# Patient Record
Sex: Male | Born: 1975 | Race: White | Hispanic: No | Marital: Married | Smoking: Never smoker
Health system: Southern US, Community
[De-identification: ages and names within clinical notes are randomized; demographics above are authoritative.]

## PROBLEM LIST (undated history)

## (undated) DIAGNOSIS — M179 Osteoarthritis of knee, unspecified: Secondary | ICD-10-CM

## (undated) DIAGNOSIS — M1710 Unilateral primary osteoarthritis, unspecified knee: Secondary | ICD-10-CM

## (undated) DIAGNOSIS — T7840XA Allergy, unspecified, initial encounter: Secondary | ICD-10-CM

## (undated) HISTORY — DX: Osteoarthritis of knee, unspecified: M17.9

## (undated) HISTORY — PX: ANTERIOR CRUCIATE LIGAMENT REPAIR: SHX115

## (undated) HISTORY — DX: Unilateral primary osteoarthritis, unspecified knee: M17.10

## (undated) HISTORY — DX: Allergy, unspecified, initial encounter: T78.40XA

---

## 2013-11-10 DIAGNOSIS — F419 Anxiety disorder, unspecified: Secondary | ICD-10-CM | POA: Insufficient documentation

## 2013-11-10 DIAGNOSIS — J309 Allergic rhinitis, unspecified: Secondary | ICD-10-CM | POA: Insufficient documentation

## 2016-05-03 DIAGNOSIS — H52223 Regular astigmatism, bilateral: Secondary | ICD-10-CM | POA: Diagnosis not present

## 2016-05-03 DIAGNOSIS — H5213 Myopia, bilateral: Secondary | ICD-10-CM | POA: Diagnosis not present

## 2016-05-14 DIAGNOSIS — D1801 Hemangioma of skin and subcutaneous tissue: Secondary | ICD-10-CM | POA: Diagnosis not present

## 2016-05-14 DIAGNOSIS — D225 Melanocytic nevi of trunk: Secondary | ICD-10-CM | POA: Diagnosis not present

## 2016-05-14 DIAGNOSIS — D1800 Hemangioma unspecified site: Secondary | ICD-10-CM | POA: Diagnosis not present

## 2016-05-14 DIAGNOSIS — D485 Neoplasm of uncertain behavior of skin: Secondary | ICD-10-CM | POA: Diagnosis not present

## 2016-05-14 DIAGNOSIS — B360 Pityriasis versicolor: Secondary | ICD-10-CM | POA: Diagnosis not present

## 2016-05-14 DIAGNOSIS — L812 Freckles: Secondary | ICD-10-CM | POA: Diagnosis not present

## 2017-04-18 DIAGNOSIS — M25561 Pain in right knee: Secondary | ICD-10-CM | POA: Diagnosis not present

## 2017-04-18 DIAGNOSIS — M238X1 Other internal derangements of right knee: Secondary | ICD-10-CM | POA: Diagnosis not present

## 2017-05-01 DIAGNOSIS — M25561 Pain in right knee: Secondary | ICD-10-CM | POA: Diagnosis not present

## 2017-05-07 DIAGNOSIS — M25561 Pain in right knee: Secondary | ICD-10-CM | POA: Diagnosis not present

## 2017-05-07 DIAGNOSIS — M238X1 Other internal derangements of right knee: Secondary | ICD-10-CM | POA: Diagnosis not present

## 2017-07-07 NOTE — Progress Notes (Signed)
Cristian Odom received his flu shot on 07/04/17 at the [facility] to LT deltoid. Lot # [id number] NDC: [phone] Mfg: [employer] Expires: 02/01/18

## 2017-08-07 ENCOUNTER — Encounter: Payer: Self-pay | Admitting: Family Medicine

## 2017-08-07 ENCOUNTER — Ambulatory Visit (INDEPENDENT_AMBULATORY_CARE_PROVIDER_SITE_OTHER): Payer: No Typology Code available for payment source | Admitting: Family Medicine

## 2017-08-07 VITALS — BP 122/76 | HR 57 | Temp 98.2°F | Ht 77.0 in | Wt 209.0 lb

## 2017-08-07 DIAGNOSIS — F419 Anxiety disorder, unspecified: Secondary | ICD-10-CM

## 2017-08-07 DIAGNOSIS — Z0000 Encounter for general adult medical examination without abnormal findings: Secondary | ICD-10-CM

## 2017-08-07 DIAGNOSIS — J301 Allergic rhinitis due to pollen: Secondary | ICD-10-CM

## 2017-08-07 DIAGNOSIS — Z13220 Encounter for screening for lipoid disorders: Secondary | ICD-10-CM | POA: Diagnosis not present

## 2017-08-07 DIAGNOSIS — M25561 Pain in right knee: Secondary | ICD-10-CM | POA: Diagnosis not present

## 2017-08-07 DIAGNOSIS — Z114 Encounter for screening for human immunodeficiency virus [HIV]: Secondary | ICD-10-CM | POA: Diagnosis not present

## 2017-08-07 DIAGNOSIS — G8929 Other chronic pain: Secondary | ICD-10-CM | POA: Diagnosis not present

## 2017-08-07 LAB — CBC WITH DIFFERENTIAL/PLATELET
Basophils Absolute: 0 10*3/uL (ref 0.0–0.1)
Basophils Relative: 0.5 % (ref 0.0–3.0)
Eosinophils Absolute: 0.3 10*3/uL (ref 0.0–0.7)
Eosinophils Relative: 5.4 % — ABNORMAL HIGH (ref 0.0–5.0)
HCT: 44 % (ref 39.0–52.0)
Hemoglobin: 14.6 g/dL (ref 13.0–17.0)
Lymphocytes Relative: 17.9 % (ref 12.0–46.0)
Lymphs Abs: 0.9 10*3/uL (ref 0.7–4.0)
MCHC: 33.3 g/dL (ref 30.0–36.0)
MCV: 91.1 fl (ref 78.0–100.0)
Monocytes Absolute: 0.5 10*3/uL (ref 0.1–1.0)
Monocytes Relative: 10.3 % (ref 3.0–12.0)
Neutro Abs: 3.4 10*3/uL (ref 1.4–7.7)
Neutrophils Relative %: 65.9 % (ref 43.0–77.0)
Platelets: 152 10*3/uL (ref 150.0–400.0)
RBC: 4.83 Mil/uL (ref 4.22–5.81)
RDW: 13.2 % (ref 11.5–15.5)
WBC: 5.2 10*3/uL (ref 4.0–10.5)

## 2017-08-07 LAB — COMPREHENSIVE METABOLIC PANEL
ALT: 16 U/L (ref 0–53)
AST: 20 U/L (ref 0–37)
Albumin: 4.2 g/dL (ref 3.5–5.2)
Alkaline Phosphatase: 36 U/L — ABNORMAL LOW (ref 39–117)
BUN: 16 mg/dL (ref 6–23)
CO2: 29 mEq/L (ref 19–32)
Calcium: 9.1 mg/dL (ref 8.4–10.5)
Chloride: 105 mEq/L (ref 96–112)
Creatinine, Ser: 1 mg/dL (ref 0.40–1.50)
GFR: 86.37 mL/min (ref >60.00)
Glucose, Bld: 86 mg/dL (ref 70–99)
Potassium: 4 mEq/L (ref 3.5–5.1)
Sodium: 141 mEq/L (ref 135–145)
Total Bilirubin: 0.9 mg/dL (ref 0.2–1.2)
Total Protein: 6.6 g/dL (ref 6.0–8.3)

## 2017-08-07 LAB — LIPID PANEL
Cholesterol: 161 mg/dL (ref 0–200)
HDL: 56.5 mg/dL (ref >39.00)
LDL Cholesterol: 87 mg/dL (ref 0–99)
NonHDL: 104.19
Total CHOL/HDL Ratio: 3
Triglycerides: 86 mg/dL (ref 0.0–149.0)
VLDL: 17.2 mg/dL (ref 0.0–40.0)

## 2017-08-07 LAB — TSH: TSH: 2.15 u[IU]/mL (ref 0.35–4.50)

## 2017-08-07 MED ORDER — FLUTICASONE PROPIONATE 50 MCG/ACT NA SUSP: 2.0000 | Freq: Every day | NASAL | 6 refills | Status: DC

## 2017-08-07 MED FILL — FLUTICASONE PROP 50 MCG SPR: 50 | 30 days supply | Qty: 16 | Fill #0

## 2017-08-07 NOTE — Progress Notes (Signed)
Cristian Odom is a 42 y.o. male is here to [employer].   Patient Care Team: Briscoe Deutscher, DO as PCP - General (Family Medicine)   History of Present Illness:   HPI: See Assessment and Plan section for Problem Based Charting of issues discussed today.  Social History   Social History Narrative   Previously played basketball. Right knee injury that led to arthroscopic clean-out and ACL repair. Bothers him enough to stop him from running. Has Orthopedist at [employer]. Wife is an Therapist, sports. Married in 2017. Combined family with four children, adolescents. Works as Clinical cytogeneticist.    Health Maintenance Due  Topic Date Due  . HIV Screening  11/01/1988   Depression screen PHQ 2/9 08/08/2017  Decreased Interest 0  PHQ - 2 Score 0     PMHx, SurgHx, SocialHx, Medications, and Allergies were reviewed in the Visit Navigator and updated as appropriate.   Past Medical History:  Diagnosis Date  . OA (osteoarthritis) of knee, right     Past Surgical History:  Procedure Laterality Date  . ANTERIOR CRUCIATE LIGAMENT REPAIR Right    History reviewed. No pertinent family history. Social History   Tobacco Use  . Smoking status: Never Smoker  . Smokeless tobacco: Never Used  Substance Use Topics  . Alcohol use: Yes    Alcohol/week: 3.0 oz    Types: 5 Glasses of wine per week  . Drug use: No   Current Medications and Allergies:   Current Outpatient Medications:  .  Cetirizine HCl 10 MG CAPS, Take by mouth., Disp: , Rfl:  .  fluticasone (FLONASE) 50 MCG/ACT nasal spray, Place 2 sprays into both nostrils daily., Disp: 16 g, Rfl: 6  No Known Allergies   Review of Systems:   Pertinent items are noted in the HPI. Otherwise, ROS is negative.  Vitals:   Vitals:   08/07/17 0839  BP: 122/76  Pulse: (!) 57  Temp: 98.2 F (36.8 C)  TempSrc: Oral  SpO2: 98%  Weight: 209 lb (94.8 kg)  Height: 6\' 5"  (1.956 m)     Body mass index is 24.78 kg/m.  Physical Exam:   Physical  Exam  Constitutional: He is oriented to person, place, and time. He appears well-developed and well-nourished. No distress.  HENT:  Head: Normocephalic and atraumatic.  Right Ear: External ear normal.  Left Ear: External ear normal.  Nose: Nose normal.  Mouth/Throat: Oropharynx is clear and moist.  Eyes: Conjunctivae and EOM are normal. Pupils are equal, round, and reactive to light.  Neck: Normal range of motion. Neck supple.  Cardiovascular: Normal rate, regular rhythm, normal heart sounds and intact distal pulses.  Pulmonary/Chest: Effort normal and breath sounds normal.  Abdominal: Soft. Bowel sounds are normal.  Musculoskeletal: Normal range of motion.       Right knee: He exhibits normal range of motion and no effusion. Tenderness found. Medial joint line tenderness noted.  Neurological: He is alert and oriented to person, place, and time.  Skin: Skin is warm and dry.  Psychiatric: He has a normal mood and affect. His behavior is normal. Judgment and thought content normal.  Nursing note and vitals reviewed.   Results for orders placed or performed in visit on 08/07/17  CBC with Differential/Platelet  Result Value Ref Range   WBC 5.2 4.0 - 10.5 K/uL   RBC 4.83 4.22 - 5.81 Mil/uL   Hemoglobin 14.6 13.0 - 17.0 g/dL   HCT 44.0 39.0 - 52.0 %   MCV  91.1 78.0 - 100.0 fl   MCHC 33.3 30.0 - 36.0 g/dL   RDW 13.2 11.5 - 15.5 %   Platelets 152.0 150.0 - 400.0 K/uL   Neutrophils Relative % 65.9 43.0 - 77.0 %   Lymphocytes Relative 17.9 12.0 - 46.0 %   Monocytes Relative 10.3 3.0 - 12.0 %   Eosinophils Relative 5.4 (H) 0.0 - 5.0 %   Basophils Relative 0.5 0.0 - 3.0 %   Neutro Abs 3.4 1.4 - 7.7 K/uL   Lymphs Abs 0.9 0.7 - 4.0 K/uL   Monocytes Absolute 0.5 0.1 - 1.0 K/uL   Eosinophils Absolute 0.3 0.0 - 0.7 K/uL   Basophils Absolute 0.0 0.0 - 0.1 K/uL  Comprehensive metabolic panel  Result Value Ref Range   Sodium 141 135 - 145 mEq/L   Potassium 4.0 3.5 - 5.1 mEq/L   Chloride 105  96 - 112 mEq/L   CO2 29 19 - 32 mEq/L   Glucose, Bld 86 70 - 99 mg/dL   BUN 16 6 - 23 mg/dL   Creatinine, Ser 1.00 0.40 - 1.50 mg/dL   Total Bilirubin 0.9 0.2 - 1.2 mg/dL   Alkaline Phosphatase 36 (L) 39 - 117 U/L   AST 20 0 - 37 U/L   ALT 16 0 - 53 U/L   Total Protein 6.6 6.0 - 8.3 g/dL   Albumin 4.2 3.5 - 5.2 g/dL   Calcium 9.1 8.4 - 10.5 mg/dL   GFR 86.37 >60.00 mL/min  HIV antibody  Result Value Ref Range   HIV 1&2 Ab, 4th Generation NON-REACTIVE NON-REACTI  Lipid panel  Result Value Ref Range   Cholesterol 161 0 - 200 mg/dL   Triglycerides 86.0 0.0 - 149.0 mg/dL   HDL 56.50 >39.00 mg/dL   VLDL 17.2 0.0 - 40.0 mg/dL   LDL Cholesterol 87 0 - 99 mg/dL   Total CHOL/HDL Ratio 3    NonHDL 104.19   TSH  Result Value Ref Range   TSH 2.15 0.35 - 4.50 uIU/mL    Assessment and Plan:   1. Lipid screening - Lipid panel  2. Encounter for screening for HIV - HIV antibody  3. Routine physical examination Patient Education below.   - CBC with Differential/Platelet - Comprehensive metabolic panel  4. Anxiety Patient is here for evaluation of anxiety.  He has the following anxiety symptoms: difficulty concentrating, insomnia, psychomotor agitation. Onset of symptoms was approximately several months ago.  Symptoms have been unchanged since that time. He denies current suicidal and homicidal ideation. Possible organic causes contributing are: none. Risk factors: none Previous treatment includes medication Lexapro.   He complains of the following medication side effects: weight gain.  - TSH  5. Chronic pain of right knee Right knee pain, intermittent, since the 1990s. He played basketball. S/P ACL repair and arthroscopic clean-out. Already followed by [facility]. Needs referral for insurance purposes. We reviewed preoccupations and symptomatic care of his knee pain.   - Ambulatory referral to Orthopedic Surgery  6. Seasonal allergic rhinitis due to pollen Patient's symptoms  include clear rhinorrhea, nasal congestion and postnasal drip. These symptoms are perennial with seasonal exacerbation. Current triggers include exposure to pollens.   - Cetirizine HCl 10 MG CAPS; Take by mouth. - fluticasone (FLONASE) 50 MCG/ACT nasal spray; Place 2 sprays into both nostrils daily.  Dispense: 16 g; Refill: 6   . Reviewed expectations re: course of current medical issues. . Discussed self-management of symptoms. . Outlined signs and symptoms indicating need for  more acute intervention. . Patient verbalized understanding and all questions were answered. Marland Kitchen Health Maintenance issues including appropriate healthy diet, exercise, and smoking avoidance were discussed with patient. . See orders for this visit as documented in the electronic medical record. . Patient received an After Visit Summary.  Briscoe Deutscher, DO , [place] 08/08/2017  Patient Counseling: [x]   Nutrition: Stressed importance of moderation in sodium/caffeine intake, saturated fat and cholesterol, caloric balance, sufficient intake of fresh fruits, vegetables, and fiber.  [x]   Stressed the importance of regular exercise.   []   Substance Abuse: Discussed cessation/primary prevention of tobacco, alcohol, or other drug use; driving or other dangerous activities under the influence; availability of treatment for abuse.   [x]   Injury prevention: Discussed safety belts, safety helmets, smoke detector, smoking near bedding or upholstery.   []   Sexuality: Discussed sexually transmitted diseases, partner selection, use of condoms, avoidance of unintended pregnancy and contraceptive alternatives.   [x]   Dental health: Discussed importance of regular tooth brushing, flossing, and dental visits.  [x]   Health maintenance and immunizations reviewed. Please refer to Health maintenance section.

## 2017-08-08 ENCOUNTER — Encounter: Payer: Self-pay | Admitting: Family Medicine

## 2017-08-08 LAB — HIV ANTIBODY (ROUTINE TESTING W REFLEX): HIV 1&2 Ab, 4th Generation: NONREACTIVE

## 2017-08-11 ENCOUNTER — Encounter: Payer: Self-pay | Admitting: Family Medicine

## 2017-08-13 MED ORDER — ALPRAZOLAM 0.5 MG PO TABS: 0.5000 mg | ORAL_TABLET | Freq: Two times a day (BID) | ORAL | 0 refills | Status: DC | PRN

## 2017-08-13 MED FILL — ALPRAZolam 0.5 MG TABS: 0.5 | 15 days supply | Qty: 30 | Fill #0

## 2017-10-29 MED FILL — FLUTICASONE PROP 50 MCG SPR: 50 | 30 days supply | Qty: 16 | Fill #1

## 2018-03-27 ENCOUNTER — Encounter: Payer: Self-pay | Admitting: Family Medicine

## 2018-03-28 MED ORDER — TADALAFIL 20 MG PO TABS: 10.0000 mg | ORAL_TABLET | ORAL | 0 refills | Status: DC | PRN

## 2018-03-30 ENCOUNTER — Other Ambulatory Visit: Payer: Self-pay | Admitting: Surgical

## 2018-03-30 MED ORDER — TADALAFIL 20 MG PO TABS: 10.0000 mg | ORAL_TABLET | ORAL | 0 refills | Status: DC | PRN

## 2018-03-31 ENCOUNTER — Other Ambulatory Visit: Payer: Self-pay | Admitting: Surgical

## 2018-03-31 MED ORDER — TADALAFIL 20 MG PO TABS: 10.0000 mg | ORAL_TABLET | ORAL | 0 refills | Status: DC | PRN

## 2018-04-07 MED ORDER — SILDENAFIL CITRATE 50 MG PO TABS: 50.0000 mg | ORAL_TABLET | Freq: Every day | ORAL | 0 refills | Status: DC | PRN

## 2018-04-07 MED FILL — SILDENAFIL CITRATE 50 MG TA: 50 | 50 days supply | Qty: 10 | Fill #0

## 2018-04-07 NOTE — Addendum Note (Signed)
Addended by: Briscoe Deutscher R on: 04/07/2018 01:16 PM   Modules accepted: Orders

## 2018-06-19 NOTE — Progress Notes (Signed)
Cristian Odom received his flu shot to his RT deltoid today at the [facility] by the undersigned. Lot #[id number] WEX:[id number] FYB:[id number] Exp:02/02/19

## 2018-07-03 MED FILL — FLUTICASONE PROP 50 MCG SPR: 50 | 30 days supply | Qty: 16 | Fill #2

## 2018-07-19 ENCOUNTER — Encounter: Payer: Self-pay | Admitting: Family Medicine

## 2018-07-20 NOTE — Progress Notes (Signed)
Cristian Odom is a 42 y.o. male is here for follow up.  History of Present Illness:   Cristian Odom, CMA acting as scribe for Dr. Briscoe Deutscher.   HPI: Patient in office for follow up on medication. He is having increased anxiety. He has prescription for Xanax that was started few months ago. He has only taken 2-3 times in over six months. He is hesitant to take due to side effects.  He has a family therapist that he sees with kids but has not been to one on his own. He has been on Lexapro in the past but stopped due to weight gain (over 20 pounds). Patient denies any suicidal thoughts. Exercising. Eating well. One episode of ED a few months ago and has not been able to get it out of his mind. Has used Viagra twice since then. Still attracted to wife, feels safe and happy in marriage, has morning erections, majority of time with no issues. He still feels so anxious that it might happen, that it inhibits sexual function.   There are no preventive care reminders to display for this patient. Depression screen [place] 2/9 07/21/2018 08/08/2017  Decreased Interest 1 0  Down, Depressed, Hopeless 2 -  PHQ - 2 Score 3 0  Altered sleeping 2 -  Tired, decreased energy 1 -  Change in appetite 2 -  Feeling bad or failure about yourself  1 -  Trouble concentrating 1 -  Moving slowly or fidgety/restless 0 -  PHQ-9 Score 10 -   PMHx, SurgHx, SocialHx, FamHx, Medications, and Allergies were reviewed in the Visit Navigator and updated as appropriate.   Patient Active Problem List   Diagnosis Date Noted  . Chronic pain of right knee 07/21/2018  . Anxiety 11/10/2013  . AR (allergic rhinitis) 11/10/2013   Social History   Tobacco Use  . Smoking status: Never Smoker  . Smokeless tobacco: Never Used  Substance Use Topics  . Alcohol use: Yes    Alcohol/week: 5.0 standard drinks    Types: 5 Glasses of wine per week  . Drug use: No   Current Medications and Allergies:   .  ALPRAZolam (XANAX) 0.5 MG  tablet, Take 1 tablet (0.5 mg total) by mouth 2 (two) times daily as needed for anxiety., Disp: 30 tablet, Rfl: 0 .  Cetirizine HCl 10 MG CAPS, Take by mouth., Disp: , Rfl:  .  fluticasone (FLONASE) 50 MCG/ACT nasal spray, Place 2 sprays into both nostrils daily., Disp: 16 g, Rfl: 6 .  sildenafil (VIAGRA) 50 MG tablet, Take 1 tablet (50 mg total) by mouth daily as needed for erectile dysfunction., Disp: 10 tablet, Rfl: 0  No Known Allergies   Review of Systems   Pertinent items are noted in the HPI. Otherwise, a complete ROS is negative.  Vitals:   Vitals:   07/21/18 0924  BP: 122/74  Pulse: 60  Temp: 97.6 F (36.4 C)  TempSrc: Oral  SpO2: 98%  Weight: 207 lb (93.9 kg)  Height: 6\' 5"  (1.956 m)     Body mass index is 24.55 kg/m.  Physical Exam:   Physical Exam Constitutional:      General: He is not in acute distress.    Appearance: He is well-developed. He is not diaphoretic.  HENT:     Head: Normocephalic and atraumatic.     Right Ear: External ear normal.     Left Ear: External ear normal.     Nose: Nose normal.  Eyes:  Conjunctiva/sclera: Conjunctivae normal.  Neck:     Musculoskeletal: Normal range of motion.  Cardiovascular:     Rate and Rhythm: Normal rate and regular rhythm.  Pulmonary:     Effort: Pulmonary effort is normal. No respiratory distress.  Abdominal:     General: Bowel sounds are normal.     Palpations: Abdomen is soft.     Tenderness: There is no abdominal tenderness.  Musculoskeletal: Normal range of motion.        General: No deformity.  Skin:    General: Skin is warm.  Neurological:     Mental Status: He is alert and oriented to person, place, and time.  Psychiatric:        Behavior: Behavior normal.    Results for orders placed or performed in visit on 08/07/17  CBC with Differential/Platelet  Result Value Ref Range   WBC 5.2 4.0 - 10.5 K/uL   RBC 4.83 4.22 - 5.81 Mil/uL   Hemoglobin 14.6 13.0 - 17.0 g/dL   HCT 44.0 39.0 -  52.0 %   MCV 91.1 78.0 - 100.0 fl   MCHC 33.3 30.0 - 36.0 g/dL   RDW 13.2 11.5 - 15.5 %   Platelets 152.0 150.0 - 400.0 K/uL   Neutrophils Relative % 65.9 43.0 - 77.0 %   Lymphocytes Relative 17.9 12.0 - 46.0 %   Monocytes Relative 10.3 3.0 - 12.0 %   Eosinophils Relative 5.4 (H) 0.0 - 5.0 %   Basophils Relative 0.5 0.0 - 3.0 %   Neutro Abs 3.4 1.4 - 7.7 K/uL   Lymphs Abs 0.9 0.7 - 4.0 K/uL   Monocytes Absolute 0.5 0.1 - 1.0 K/uL   Eosinophils Absolute 0.3 0.0 - 0.7 K/uL   Basophils Absolute 0.0 0.0 - 0.1 K/uL  Comprehensive metabolic panel  Result Value Ref Range   Sodium 141 135 - 145 mEq/L   Potassium 4.0 3.5 - 5.1 mEq/L   Chloride 105 96 - 112 mEq/L   CO2 29 19 - 32 mEq/L   Glucose, Bld 86 70 - 99 mg/dL   BUN 16 6 - 23 mg/dL   Creatinine, Ser 1.00 0.40 - 1.50 mg/dL   Total Bilirubin 0.9 0.2 - 1.2 mg/dL   Alkaline Phosphatase 36 (L) 39 - 117 U/L   AST 20 0 - 37 U/L   ALT 16 0 - 53 U/L   Total Protein 6.6 6.0 - 8.3 g/dL   Albumin 4.2 3.5 - 5.2 g/dL   Calcium 9.1 8.4 - 10.5 mg/dL   GFR 86.37 >60.00 mL/min  HIV antibody  Result Value Ref Range   HIV 1&2 Ab, 4th Generation NON-REACTIVE NON-REACTI  Lipid panel  Result Value Ref Range   Cholesterol 161 0 - 200 mg/dL   Triglycerides 86.0 0.0 - 149.0 mg/dL   HDL 56.50 >39.00 mg/dL   VLDL 17.2 0.0 - 40.0 mg/dL   LDL Cholesterol 87 0 - 99 mg/dL   Total CHOL/HDL Ratio 3    NonHDL 104.19   TSH  Result Value Ref Range   TSH 2.15 0.35 - 4.50 uIU/mL   Assessment and Plan:   Cristian was seen today for follow-up.  Diagnoses and all orders for this visit:  Other male erectile dysfunction Comments: Likely due to anxiety in this young, healthy, anxious man. Will check testosterone level, but I expect it to be normal. See AVS. Orders: -     Testos,Total,Free and SHBG (Male)  Anxiety Comments: Patient asked about Wellbutrin today. We discussed the  fact that it is activating and could cause increased anxiety, but may be great  option as combo with SSRI. Orders: -     FLUoxetine (PROZAC) 20 MG tablet; Take 1 tablet (20 mg total) by mouth daily. -     buPROPion (WELLBUTRIN) 75 MG tablet; Take 1 tablet (75 mg total) by mouth 2 (two) times daily.   . Orders and follow up as documented in [place], reviewed diet, exercise and weight control, cardiovascular risk and specific lipid/LDL goals reviewed, reviewed medications and side effects in detail.  . Reviewed expectations re: course of current medical issues. . Outlined signs and symptoms indicating need for more acute intervention. . Patient verbalized understanding and all questions were answered. . Patient received an After Visit Summary.  CMA served as Education administrator during this visit. History, Physical, and Plan performed by medical provider. The above documentation has been reviewed and is accurate and complete. Briscoe Deutscher, D.O.  Briscoe Deutscher, DO [place], [facility] 07/21/2018

## 2018-07-21 ENCOUNTER — Ambulatory Visit (INDEPENDENT_AMBULATORY_CARE_PROVIDER_SITE_OTHER): Payer: No Typology Code available for payment source | Admitting: Family Medicine

## 2018-07-21 ENCOUNTER — Encounter: Payer: Self-pay | Admitting: Family Medicine

## 2018-07-21 VITALS — BP 122/74 | HR 60 | Temp 97.6°F | Ht 77.0 in | Wt 207.0 lb

## 2018-07-21 DIAGNOSIS — G8929 Other chronic pain: Secondary | ICD-10-CM | POA: Diagnosis not present

## 2018-07-21 DIAGNOSIS — M25561 Pain in right knee: Secondary | ICD-10-CM | POA: Diagnosis not present

## 2018-07-21 DIAGNOSIS — F419 Anxiety disorder, unspecified: Secondary | ICD-10-CM | POA: Diagnosis not present

## 2018-07-21 DIAGNOSIS — N528 Other male erectile dysfunction: Secondary | ICD-10-CM

## 2018-07-21 MED ORDER — BUPROPION HCL 75 MG PO TABS: 75.0000 mg | ORAL_TABLET | Freq: Two times a day (BID) | ORAL | 3 refills | Status: DC

## 2018-07-21 MED ORDER — FLUOXETINE HCL 20 MG PO TABS: 20.0000 mg | ORAL_TABLET | Freq: Every day | ORAL | 3 refills | Status: DC

## 2018-07-21 MED FILL — buPROPion HCL 75 MG TABS: 75 | 30 days supply | Qty: 60 | Fill #0

## 2018-07-21 MED FILL — FLUoxetine HCL 20 MG TABS: 20 | 30 days supply | Qty: 30 | Fill #0

## 2018-07-21 NOTE — Patient Instructions (Addendum)
I want you to consider EMDR therapy with Arsenio Katz, [phone].

## 2018-07-28 ENCOUNTER — Encounter: Payer: Self-pay | Admitting: Family Medicine

## 2018-07-30 MED ORDER — ONDANSETRON 4 MG PO TBDP: 4.0000 mg | ORAL_TABLET | Freq: Three times a day (TID) | ORAL | 0 refills | Status: DC | PRN

## 2018-07-30 MED FILL — ONDANSETRON ODT 4 MG TABLET: 4 | 6 days supply | Qty: 20 | Fill #0

## 2018-08-17 MED FILL — FLUoxetine HCL 20 MG TABS: 20 | 30 days supply | Qty: 30 | Fill #1

## 2018-09-16 MED FILL — FLUoxetine HCL 20 MG TABS: 20 | 30 days supply | Qty: 30 | Fill #2

## 2018-10-17 MED FILL — FLUoxetine HCL 20 MG TABS: 20 | 30 days supply | Qty: 30 | Fill #3

## 2018-10-17 MED FILL — buPROPion HCL 75 MG TABS: 75 | 30 days supply | Qty: 60 | Fill #1

## 2018-10-20 ENCOUNTER — Ambulatory Visit (INDEPENDENT_AMBULATORY_CARE_PROVIDER_SITE_OTHER): Payer: No Typology Code available for payment source | Admitting: Family Medicine

## 2018-10-20 ENCOUNTER — Encounter: Payer: Self-pay | Admitting: Family Medicine

## 2018-10-20 ENCOUNTER — Other Ambulatory Visit: Payer: Self-pay

## 2018-10-20 ENCOUNTER — Telehealth: Payer: Self-pay | Admitting: Family Medicine

## 2018-10-20 VITALS — BP 100/64 | HR 63 | Temp 98.4°F | Ht 77.0 in | Wt 210.2 lb

## 2018-10-20 DIAGNOSIS — N528 Other male erectile dysfunction: Secondary | ICD-10-CM | POA: Diagnosis not present

## 2018-10-20 DIAGNOSIS — J301 Allergic rhinitis due to pollen: Secondary | ICD-10-CM

## 2018-10-20 DIAGNOSIS — F419 Anxiety disorder, unspecified: Secondary | ICD-10-CM | POA: Diagnosis not present

## 2018-10-20 MED ORDER — BUPROPION HCL ER (XL) 150 MG PO TB24: 150.0000 mg | ORAL_TABLET | Freq: Every day | ORAL | 3 refills | Status: DC

## 2018-10-20 MED ORDER — TADALAFIL 20 MG PO TABS: 10.0000 mg | ORAL_TABLET | ORAL | 11 refills | Status: DC | PRN

## 2018-10-20 MED FILL — buPROPion HCL ER (XL) 150 M: 150 | 90 days supply | Qty: 90 | Fill #0 | Status: TO

## 2018-10-20 NOTE — Progress Notes (Signed)
Mali Bihm is a 43 y.o. male is here for follow up.  History of Present Illness:   HPI: See Assessment and Plan section for Problem Based Charting of issues discussed today.   There are no preventive care reminders to display for this patient. Depression screen [facility] 2/9 07/21/2018 08/08/2017  Decreased Interest 1 0  Down, Depressed, Hopeless 2 -  PHQ - 2 Score 3 0  Altered sleeping 2 -  Tired, decreased energy 1 -  Change in appetite 2 -  Feeling bad or failure about yourself  1 -  Trouble concentrating 1 -  Moving slowly or fidgety/restless 0 -  PHQ-9 Score 10 -   PMHx, SurgHx, SocialHx, FamHx, Medications, and Allergies were reviewed in the Visit Navigator and updated as appropriate.   Patient Active Problem List   Diagnosis Date Noted  . Chronic pain of right knee 07/21/2018  . Anxiety 11/10/2013  . AR (allergic rhinitis) 11/10/2013   Social History   Tobacco Use  . Smoking status: Never Smoker  . Smokeless tobacco: Never Used  Substance Use Topics  . Alcohol use: Yes    Alcohol/week: 5.0 standard drinks    Types: 5 Glasses of wine per week  . Drug use: No   Current Medications and Allergies:   .  ALPRAZolam (XANAX) 0.5 MG tablet, Take 1 tablet (0.5 mg total) by mouth 2 (two) times daily as needed for anxiety., Disp: 30 tablet, Rfl: 0 .  Cetirizine HCl 10 MG CAPS, Take 10 mg by mouth daily. , Disp: , Rfl:  .  FLUoxetine (PROZAC) 20 MG tablet, Take 1 tablet (20 mg total) by mouth daily., Disp: 30 tablet, Rfl: 3 .  sildenafil (VIAGRA) 50 MG tablet, Take 1 tablet (50 mg total) by mouth daily as needed for erectile dysfunction., Disp: 10 tablet, Rfl: 0 .  buPROPion (WELLBUTRIN XL) 150 MG 24 hr tablet, Take 1 tablet (150 mg total) by mouth daily., Disp: 90 tablet, Rfl: 3 (TAKING 75 MG PO BID CURRENTLY)  No Known Allergies   Review of Systems   Pertinent items are noted in the HPI. Otherwise, ROS is negative.  Vitals:   Vitals:   10/20/18 0906  BP: 100/64   Pulse: 63  Temp: 98.4 F (36.9 C)  TempSrc: Oral  SpO2: 98%  Weight: 210 lb 4 oz (95.4 kg)  Height: 6\' 5"  (1.956 m)     Body mass index is 24.93 kg/m.  Physical Exam:   Physical Exam Vitals signs and nursing note reviewed.  Constitutional:      General: He is not in acute distress.    Appearance: He is well-developed.  HENT:     Head: Normocephalic and atraumatic.     Right Ear: External ear normal.     Left Ear: External ear normal.     Nose: Nose normal.  Eyes:     Conjunctiva/sclera: Conjunctivae normal.     Pupils: Pupils are equal, round, and reactive to light.  Neck:     Musculoskeletal: Normal range of motion and neck supple.  Cardiovascular:     Rate and Rhythm: Normal rate and regular rhythm.     Heart sounds: Normal heart sounds.  Pulmonary:     Effort: Pulmonary effort is normal.     Breath sounds: Normal breath sounds.  Abdominal:     General: Bowel sounds are normal.     Palpations: Abdomen is soft.  Musculoskeletal: Normal range of motion.  Skin:    General: Skin is  warm and dry.  Neurological:     Mental Status: He is alert and oriented to person, place, and time.  Psychiatric:        Behavior: Behavior normal.        Thought Content: Thought content normal.        Judgment: Judgment normal.     Assessment and Plan:   Mali was seen today for anxiety.  Diagnoses and all orders for this visit:  Anxiety Comments: Improving with current treatment.  Will increase Wellbutrin to 150 mg p.o. daily.  He did ask about decreasing the Prozac.  We discussed slowly weaning protocol if he decides that is when he wants to do.  I would like for the Wellbutrin back to the echo first. Orders: -     buPROPion (WELLBUTRIN XL) 150 MG 24 hr tablet; Take 1 tablet (150 mg total) by mouth daily.  Other male erectile dysfunction Comments: Secondary to anxiety.  To the 3 times taking Viagra was not helpful.  He would like to try Cialis. Orders: -     tadalafil  (ADCIRCA/CIALIS) 20 MG tablet; Take 0.5-1 tablets (10-20 mg total) by mouth every other day as needed for erectile dysfunction.  Seasonal allergic rhinitis due to pollen Comments: Well controlled.  No signs of complications, medication side effects, or red flags.  Continue current regimen.    Meds ordered this encounter  Medications  . buPROPion (WELLBUTRIN XL) 150 MG 24 hr tablet    Sig: Take 1 tablet (150 mg total) by mouth daily.    Dispense:  90 tablet    Refill:  3  . tadalafil (ADCIRCA/CIALIS) 20 MG tablet    Sig: Take 0.5-1 tablets (10-20 mg total) by mouth every other day as needed for erectile dysfunction.    Dispense:  5 tablet    Refill:  11    . Reviewed expectations re: course of current medical issues. . Discussed self-management of symptoms. . Outlined signs and symptoms indicating need for more acute intervention. . Patient verbalized understanding and all questions were answered. Marland Kitchen Health Maintenance issues including appropriate healthy diet, exercise, and smoking avoidance were discussed with patient. . See orders for this visit as documented in the electronic medical record. . Patient received an After Visit Summary.  Briscoe Deutscher, DO [place], [facility] 10/20/2018

## 2018-10-20 NOTE — Telephone Encounter (Signed)
See note  Copied from [place] [id number]. Topic: General - Other >> Oct 20, 2018  2:00 PM Carolyn Stare wrote:  Pt has questions about the dosage of the below med and would like a call back ,he said he thought there was going to be a increase or does the XL of the below med make it a difference that would not require a increase   buPROPion (WELLBUTRIN XL) 150 MG 24 hr tablet

## 2018-10-20 NOTE — Telephone Encounter (Signed)
Please advise 

## 2018-10-21 ENCOUNTER — Encounter: Payer: Self-pay | Admitting: Family Medicine

## 2018-10-22 NOTE — Telephone Encounter (Signed)
Addressed in mychart message

## 2018-11-10 ENCOUNTER — Other Ambulatory Visit: Payer: Self-pay | Admitting: Family Medicine

## 2018-11-10 DIAGNOSIS — F419 Anxiety disorder, unspecified: Secondary | ICD-10-CM

## 2018-11-10 MED FILL — FLUoxetine HCL 20 MG TABS: 20 | 90 days supply | Qty: 90 | Fill #0

## 2018-11-10 NOTE — Telephone Encounter (Signed)
Last OV 10/20/2018 Last refill 07/21/2018 #30/3 Next OV 04/23/2019

## 2018-12-31 MED FILL — buPROPion HCL ER (XL) 150 M: 150 | 90 days supply | Qty: 90 | Fill #0

## 2019-02-02 MED FILL — FLUOXETINE HCL 20 MG TABS: 20 | 90 days supply | Qty: 90 | Fill #1

## 2019-03-25 MED FILL — buPROPion HCL ER (XL) 150 M: 150 | 90 days supply | Qty: 90 | Fill #1

## 2019-04-23 ENCOUNTER — Ambulatory Visit: Payer: No Typology Code available for payment source | Admitting: Family Medicine

## 2019-05-07 ENCOUNTER — Other Ambulatory Visit: Payer: Self-pay

## 2019-05-07 ENCOUNTER — Ambulatory Visit (INDEPENDENT_AMBULATORY_CARE_PROVIDER_SITE_OTHER): Payer: No Typology Code available for payment source

## 2019-05-07 ENCOUNTER — Encounter: Payer: Self-pay | Admitting: Family Medicine

## 2019-05-07 DIAGNOSIS — Z23 Encounter for immunization: Secondary | ICD-10-CM | POA: Diagnosis not present

## 2019-05-27 ENCOUNTER — Ambulatory Visit (INDEPENDENT_AMBULATORY_CARE_PROVIDER_SITE_OTHER): Payer: No Typology Code available for payment source | Admitting: Physician Assistant

## 2019-05-27 ENCOUNTER — Encounter: Payer: Self-pay | Admitting: Physician Assistant

## 2019-05-27 DIAGNOSIS — F419 Anxiety disorder, unspecified: Secondary | ICD-10-CM | POA: Diagnosis not present

## 2019-05-27 MED ORDER — BUPROPION HCL ER (XL) 150 MG PO TB24: 150.0000 mg | ORAL_TABLET | Freq: Every day | ORAL | 3 refills | Status: DC

## 2019-05-27 MED ORDER — SERTRALINE HCL 25 MG PO TABS: 25.0000 mg | ORAL_TABLET | Freq: Every day | ORAL | 0 refills | Status: DC

## 2019-05-27 MED FILL — SERTRALINE HCL 25 MG TABLET: 25 | 30 days supply | Qty: 30 | Fill #0

## 2019-05-27 NOTE — Assessment & Plan Note (Signed)
Uncontrolled.  Continue Wellbutrin 150 mg daily.  Will will trial low-dose Zoloft 25 mg daily.  He has a follow-up with me in 2 weeks for a physical.  We will check in at that time.  If he does not do well with Zoloft, we will likely try to transition to Trintellix, and wean from Wellbutrin. I discussed with patient that if they develop any SI, to tell someone immediately and seek medical attention. Encouraged ongoing therapy.

## 2019-05-27 NOTE — Progress Notes (Signed)
Virtual Visit via Video   I connected with Cristian Odom on 05/27/19 at  8:00 AM EDT by a video enabled telemedicine application and verified that I am speaking with the correct person using two identifiers. Location patient: Home Location provider: [employer], Office Persons participating in the virtual visit: Cristian Heckard, Inda Coke [place], Anselmo Pickler, LPN   I discussed the limitations of evaluation and management by telemedicine and the availability of in person appointments. The patient expressed understanding and agreed to proceed.  I acted as a Education administrator for [employer], [employer], LPN  Subjective:   HPI:  Pt is transferring from Dr. Juleen China  Anxiety Pt following up today. Currently taking Wellbutrin 150 mg daily. He stopped Prozac about 1 1/2 months ago cause he did not like the way it made him feel was jittery and did not feel it was working. Pt said he has been under a lot of stress lately.  He does see a therapist but very infrequently.  He does state that it is helpful when he does see a therapist.  He also has tried Lexapro in the past but it caused weight gain.  He states that it worked very well for his mood but had to come off of it due to weight gain.  Denies any suicidal homicidal ideation.  He states that the Wellbutrin does not feel like it is making his anxiety worse but he does not feel like it is making it any better either.  He has struggled with ED due to anxiety, and it is an intermittent issue for him.  GAD 7 : Generalized Anxiety Score 05/27/2019 10/20/2018 07/21/2018  Nervous, Anxious, on Edge 2 1 3   Control/stop worrying 2 1 3   Worry too much - different things 1 2 3   Trouble relaxing 1 2 3   Restless 0 0 1  Easily annoyed or irritable 0 1 2  Afraid - awful might happen 1 1 2   Total GAD 7 Score 7 8 17   Anxiety Difficulty Not difficult at all Somewhat difficult Very difficult      Office Visit from 07/21/2018 in [place]  PHQ-9 Total Score  10      ROS: See pertinent positives and negatives per HPI.  Patient Active Problem List   Diagnosis Date Noted  . Chronic pain of right knee 07/21/2018  . Anxiety 11/10/2013  . AR (allergic rhinitis) 11/10/2013    Social History   Tobacco Use  . Smoking status: Never Smoker  . Smokeless tobacco: Never Used  Substance Use Topics  . Alcohol use: Yes    Alcohol/week: 5.0 standard drinks    Types: 5 Glasses of wine per week    Current Outpatient Medications:  .  ALPRAZolam (XANAX) 0.5 MG tablet, Take 1 tablet (0.5 mg total) by mouth 2 (two) times daily as needed for anxiety., Disp: 30 tablet, Rfl: 0 .  buPROPion (WELLBUTRIN XL) 150 MG 24 hr tablet, Take 1 tablet (150 mg total) by mouth daily., Disp: 90 tablet, Rfl: 3 .  Cetirizine HCl 10 MG CAPS, Take 10 mg by mouth daily. , Disp: , Rfl:  .  fluticasone (FLONASE) 50 MCG/ACT nasal spray, Place 2 sprays into both nostrils daily., Disp: 16 g, Rfl: 6 .  MAGNESIUM CITRATE PO, Take 1 capsule by mouth daily., Disp: , Rfl:  .  Omega-3 Fatty Acids (FISH OIL PO), Take 2 capsules by mouth daily., Disp: , Rfl:  .  ondansetron (ZOFRAN ODT) 4  MG disintegrating tablet, Take 1 tablet (4 mg total) by mouth every 8 (eight) hours as needed for nausea or vomiting., Disp: 20 tablet, Rfl: 0 .  tadalafil (ADCIRCA/CIALIS) 20 MG tablet, Take 0.5-1 tablets (10-20 mg total) by mouth every other day as needed for erectile dysfunction., Disp: 5 tablet, Rfl: 11 .  sertraline (ZOLOFT) 25 MG tablet, Take 1 tablet (25 mg total) by mouth daily., Disp: 30 tablet, Rfl: 0  No Known Allergies  Objective:   VITALS: Per patient if applicable, see vitals. GENERAL: Alert, appears well and in no acute distress. HEENT: Atraumatic, conjunctiva clear, no obvious abnormalities on inspection of external nose and ears. NECK: Normal movements of the head and neck. CARDIOPULMONARY: No increased WOB. Speaking in clear sentences.  I:E ratio WNL.  MS: Moves all visible extremities without noticeable abnormality. PSYCH: Pleasant and cooperative, well-groomed. Speech normal rate and rhythm. Affect is appropriate. Insight and judgement are appropriate. Attention is focused, linear, and appropriate.  NEURO: CN grossly intact. Oriented as arrived to appointment on time with no prompting. Moves both UE equally.  SKIN: No obvious lesions, wounds, erythema, or cyanosis noted on face or hands.  Assessment and Plan:   Anxiety Uncontrolled.  Continue Wellbutrin 150 mg daily.  Will will trial low-dose Zoloft 25 mg daily.  He has a follow-up with me in 2 weeks for a physical.  We will check in at that time.  If he does not do well with Zoloft, we will likely try to transition to Trintellix, and wean from Wellbutrin. I discussed with patient that if they develop any SI, to tell someone immediately and seek medical attention. Encouraged ongoing therapy.   . Reviewed expectations re: course of current medical issues. . Discussed self-management of symptoms. . Outlined signs and symptoms indicating need for more acute intervention. . Patient verbalized understanding and all questions were answered. Marland Kitchen Health Maintenance issues including appropriate healthy diet, exercise, and smoking avoidance were discussed with patient. . See orders for this visit as documented in the electronic medical record.  I discussed the assessment and treatment plan with the patient. The patient was provided an opportunity to ask questions and all were answered. The patient agreed with the plan and demonstrated an understanding of the instructions.   The patient was advised to call back or seek an in-person evaluation if the symptoms worsen or if the condition fails to improve as anticipated.   CMA or LPN served as scribe during this visit. History, Physical, and Plan performed by medical provider. The above documentation has been reviewed and is accurate and  complete.   [place], [place] 05/27/2019

## 2019-05-31 ENCOUNTER — Other Ambulatory Visit: Payer: Self-pay | Admitting: Family Medicine

## 2019-05-31 ENCOUNTER — Other Ambulatory Visit: Payer: Self-pay | Admitting: Physician Assistant

## 2019-05-31 ENCOUNTER — Encounter: Payer: Self-pay | Admitting: Physician Assistant

## 2019-05-31 MED ORDER — ALPRAZOLAM 0.5 MG PO TABS: 0.5000 mg | ORAL_TABLET | Freq: Two times a day (BID) | ORAL | 0 refills | Status: DC | PRN

## 2019-05-31 MED ORDER — ONDANSETRON 4 MG PO TBDP: 4.0000 mg | ORAL_TABLET | Freq: Three times a day (TID) | ORAL | 0 refills | Status: DC | PRN

## 2019-05-31 MED FILL — ONDANSETRON ODT 4 MG TABLET: 4 | 7 days supply | Qty: 20 | Fill #0

## 2019-06-01 MED FILL — ALPRAZolam 0.5 MG TABS: 0.5 | 15 days supply | Qty: 30 | Fill #0

## 2019-06-04 MED FILL — SERTRALINE HCL 25 MG TABLET: 25 | 30 days supply | Qty: 30 | Fill #0

## 2019-06-09 ENCOUNTER — Ambulatory Visit: Payer: No Typology Code available for payment source | Admitting: Physician Assistant

## 2019-06-24 MED FILL — buPROPion HCL ER (XL) 150 M: 150 | 90 days supply | Qty: 90 | Fill #2

## 2019-06-28 ENCOUNTER — Encounter: Payer: Self-pay | Admitting: Physician Assistant

## 2019-07-03 ENCOUNTER — Other Ambulatory Visit: Payer: Self-pay | Admitting: Physician Assistant

## 2019-07-05 ENCOUNTER — Other Ambulatory Visit: Payer: Self-pay | Admitting: Physician Assistant

## 2019-07-05 MED FILL — SERTRALINE HCL 25 MG TABLET: 25 | 30 days supply | Qty: 30 | Fill #0

## 2019-07-08 ENCOUNTER — Encounter: Payer: Self-pay | Admitting: Physician Assistant

## 2019-08-02 ENCOUNTER — Other Ambulatory Visit: Payer: Self-pay | Admitting: Physician Assistant

## 2019-08-03 MED FILL — SERTRALINE HCL 25 MG TABLET: 25 | 30 days supply | Qty: 30 | Fill #0

## 2019-08-30 ENCOUNTER — Other Ambulatory Visit: Payer: Self-pay | Admitting: Physician Assistant

## 2019-08-30 MED FILL — SERTRALINE HCL 25 MG TABLET: 25 | 30 days supply | Qty: 30 | Fill #0

## 2019-09-08 ENCOUNTER — Encounter: Payer: Self-pay | Admitting: Physician Assistant

## 2019-09-08 ENCOUNTER — Ambulatory Visit (INDEPENDENT_AMBULATORY_CARE_PROVIDER_SITE_OTHER): Payer: No Typology Code available for payment source | Admitting: Physician Assistant

## 2019-09-08 ENCOUNTER — Other Ambulatory Visit: Payer: Self-pay

## 2019-09-08 VITALS — BP 120/78 | HR 61 | Temp 97.2°F | Ht 77.0 in | Wt 216.0 lb

## 2019-09-08 DIAGNOSIS — Z13220 Encounter for screening for lipoid disorders: Secondary | ICD-10-CM

## 2019-09-08 DIAGNOSIS — Z0000 Encounter for general adult medical examination without abnormal findings: Secondary | ICD-10-CM | POA: Diagnosis not present

## 2019-09-08 DIAGNOSIS — Z23 Encounter for immunization: Secondary | ICD-10-CM | POA: Diagnosis not present

## 2019-09-08 DIAGNOSIS — Z136 Encounter for screening for cardiovascular disorders: Secondary | ICD-10-CM

## 2019-09-08 DIAGNOSIS — N528 Other male erectile dysfunction: Secondary | ICD-10-CM | POA: Diagnosis not present

## 2019-09-08 DIAGNOSIS — R21 Rash and other nonspecific skin eruption: Secondary | ICD-10-CM | POA: Diagnosis not present

## 2019-09-08 DIAGNOSIS — F419 Anxiety disorder, unspecified: Secondary | ICD-10-CM | POA: Diagnosis not present

## 2019-09-08 LAB — CBC WITH DIFFERENTIAL/PLATELET
Basophils Absolute: 0 10*3/uL (ref 0.0–0.1)
Basophils Relative: 0.6 % (ref 0.0–3.0)
Eosinophils Absolute: 0.3 10*3/uL (ref 0.0–0.7)
Eosinophils Relative: 8.3 % — ABNORMAL HIGH (ref 0.0–5.0)
HCT: 44 % (ref 39.0–52.0)
Hemoglobin: 14.9 g/dL (ref 13.0–17.0)
Lymphocytes Relative: 36.4 % (ref 12.0–46.0)
Lymphs Abs: 1.2 10*3/uL (ref 0.7–4.0)
MCHC: 33.8 g/dL (ref 30.0–36.0)
MCV: 90 fl (ref 78.0–100.0)
Monocytes Absolute: 0.3 10*3/uL (ref 0.1–1.0)
Monocytes Relative: 9 % (ref 3.0–12.0)
Neutro Abs: 1.5 10*3/uL (ref 1.4–7.7)
Neutrophils Relative %: 45.7 % (ref 43.0–77.0)
Platelets: 169 10*3/uL (ref 150.0–400.0)
RBC: 4.89 Mil/uL (ref 4.22–5.81)
RDW: 13.6 % (ref 11.5–15.5)
WBC: 3.4 10*3/uL — ABNORMAL LOW (ref 4.0–10.5)

## 2019-09-08 LAB — COMPREHENSIVE METABOLIC PANEL
ALT: 13 U/L (ref 0–53)
AST: 19 U/L (ref 0–37)
Albumin: 4.2 g/dL (ref 3.5–5.2)
Alkaline Phosphatase: 42 U/L (ref 39–117)
BUN: 21 mg/dL (ref 6–23)
CO2: 30 mEq/L (ref 19–32)
Calcium: 9.2 mg/dL (ref 8.4–10.5)
Chloride: 104 mEq/L (ref 96–112)
Creatinine, Ser: 1.19 mg/dL (ref 0.40–1.50)
GFR: 65.86 mL/min (ref >60.00)
Glucose, Bld: 95 mg/dL (ref 70–99)
Potassium: 4.4 mEq/L (ref 3.5–5.1)
Sodium: 140 mEq/L (ref 135–145)
Total Bilirubin: 0.5 mg/dL (ref 0.2–1.2)
Total Protein: 6.6 g/dL (ref 6.0–8.3)

## 2019-09-08 LAB — LIPID PANEL
Cholesterol: 183 mg/dL (ref 0–200)
HDL: 53.5 mg/dL (ref >39.00)
LDL Cholesterol: 121 mg/dL — ABNORMAL HIGH (ref 0–99)
NonHDL: 129.98
Total CHOL/HDL Ratio: 3
Triglycerides: 47 mg/dL (ref 0.0–149.0)
VLDL: 9.4 mg/dL (ref 0.0–40.0)

## 2019-09-08 MED ORDER — KETOCONAZOLE 2 % EX CREA: TOPICAL_CREAM | CUTANEOUS | 0 refills | Status: DC

## 2019-09-08 MED FILL — KETOCONAZOLE 2% CREAM: 2 | 7 days supply | Qty: 15 | Fill #0

## 2019-09-08 NOTE — Patient Instructions (Signed)
It was great to see you!  For your rash: trial the ketoconazole ointment; use the toe nail fungus recipe for your toenail  Please go to the lab for blood work.   Our office will call you with your results unless you have chosen to receive results via MyChart.  If your blood work is normal we will follow-up each year for physicals and as scheduled for chronic medical problems.  If anything is abnormal we will treat accordingly and get you in for a follow-up.  Take care,  [facility] Maintenance, Male Adopting a healthy lifestyle and getting preventive care are important in promoting health and wellness. Ask your health care provider about:  The right schedule for you to have regular tests and exams.  Things you can do on your own to prevent diseases and keep yourself healthy. What should I know about diet, weight, and exercise? Eat a healthy diet   Eat a diet that includes plenty of vegetables, fruits, low-fat dairy products, and lean protein.  Do not eat a lot of foods that are high in solid fats, added sugars, or sodium. Maintain a healthy weight Body mass index (BMI) is a measurement that can be used to identify possible weight problems. It estimates body fat based on height and weight. Your health care provider can help determine your BMI and help you achieve or maintain a healthy weight. Get regular exercise Get regular exercise. This is one of the most important things you can do for your health. Most adults should:  Exercise for at least 150 minutes each week. The exercise should increase your heart rate and make you sweat (moderate-intensity exercise).  Do strengthening exercises at least twice a week. This is in addition to the moderate-intensity exercise.  Spend less time sitting. Even light physical activity can be beneficial. Watch cholesterol and blood lipids Have your blood tested for lipids and cholesterol at 46 years of age, then have this test every 5  years. You may need to have your cholesterol levels checked more often if:  Your lipid or cholesterol levels are high.  You are older than 46 years of age.  You are at high risk for heart disease. What should I know about cancer screening? Many types of cancers can be detected early and may often be prevented. Depending on your health history and family history, you may need to have cancer screening at various ages. This may include screening for:  Colorectal cancer.  Prostate cancer.  Skin cancer.  Lung cancer. What should I know about heart disease, diabetes, and high blood pressure? Blood pressure and heart disease  High blood pressure causes heart disease and increases the risk of stroke. This is more likely to develop in people who have high blood pressure readings, are of African descent, or are overweight.  Talk with your health care provider about your target blood pressure readings.  Have your blood pressure checked: ? Every 3-5 years if you are 40-18 years of age. ? Every year if you are 18 years old or older.  If you are between the ages of 50 and 22 and are a current or former smoker, ask your health care provider if you should have a one-time screening for abdominal aortic aneurysm (AAA). Diabetes Have regular diabetes screenings. This checks your fasting blood sugar level. Have the screening done:  Once every three years after age 83 if you are at a normal weight and have a low risk for diabetes.  More often and at a younger age if you are overweight or have a high risk for diabetes. What should I know about preventing infection? Hepatitis B If you have a higher risk for hepatitis B, you should be screened for this virus. Talk with your health care provider to find out if you are at risk for hepatitis B infection. Hepatitis C Blood testing is recommended for:  Everyone born from [birth year] through 1967.  Anyone with known risk factors for hepatitis C. Sexually  transmitted infections (STIs)  You should be screened each year for STIs, including gonorrhea and chlamydia, if: ? You are sexually active and are younger than 46 years of age. ? You are older than 46 years of age and your health care provider tells you that you are at risk for this type of infection. ? Your sexual activity has changed since you were last screened, and you are at increased risk for chlamydia or gonorrhea. Ask your health care provider if you are at risk.  Ask your health care provider about whether you are at high risk for HIV. Your health care provider may recommend a prescription medicine to help prevent HIV infection. If you choose to take medicine to prevent HIV, you should first get tested for HIV. You should then be tested every 3 months for as long as you are taking the medicine. Follow these instructions at home: Lifestyle  Do not use any products that contain nicotine or tobacco, such as cigarettes, e-cigarettes, and chewing tobacco. If you need help quitting, ask your health care provider.  Do not use street drugs.  Do not share needles.  Ask your health care provider for help if you need support or information about quitting drugs. Alcohol use  Do not drink alcohol if your health care provider tells you not to drink.  If you drink alcohol: ? Limit how much you have to 0-2 drinks a day. ? Be aware of how much alcohol is in your drink. In the [place] one drink equals one 12 oz bottle of beer (355 mL), one 5 oz glass of wine (148 mL), or one 1 oz glass of hard liquor (44 mL). General instructions  Schedule regular health, dental, and eye exams.  Stay current with your vaccines.  Tell your health care provider if: ? You often feel depressed. ? You have ever been abused or do not feel safe at home. Summary  Adopting a healthy lifestyle and getting preventive care are important in promoting health and wellness.  Follow your health care provider's  instructions about healthy diet, exercising, and getting tested or screened for diseases.  Follow your health care provider's instructions on monitoring your cholesterol and blood pressure. This information is not intended to replace advice given to you by your health care provider. Make sure you discuss any questions you have with your health care provider. Document Revised: 07/15/2018 Document Reviewed: 07/15/2018 Elsevier Patient Education  2020 [employer].

## 2019-09-08 NOTE — Addendum Note (Signed)
Addended by: Marian Sorrow on: 09/08/2019 09:05 AM   Modules accepted: Orders

## 2019-09-08 NOTE — Progress Notes (Signed)
I acted as a Education administrator for [employer], PA-C [employer], LPN  Subjective:    Cristian Odom is a 44 y.o. male and is here for a comprehensive physical exam.  HPI  Health Maintenance Due  Topic Date Due  . TETANUS/TDAP  07/05/2019    Acute Concerns: Rash -- has had erythematous itchy rash on R toes, R shin and R groin area. He has used OTC hydrocortisone cream for this. Denies: environmental changes, new pets, pets that sleep in the bed, contacts with similar rash  Chronic Issues: Anxiety -- currently on Zoloft 25 mg daily, Wellbutrin 150 mg daily Erectile dysfunction -- use cialis prn, effective when he does use it  Health Maintenance: Immunizations -- UTD, will give Tdap today Colonoscopy -- N/A PSA -- N/A Diet -- eats all food groups Caffeine intake -- coffee daily Sleep habits -- overall good Exercise -- home workouts Weight -- Weight: 216 lb (98 kg)  Weight history Wt Readings from Last 10 Encounters:  09/08/19 216 lb (98 kg)  05/27/19 210 lb (95.3 kg)  10/20/18 210 lb 4 oz (95.4 kg)  07/21/18 207 lb (93.9 kg)  08/07/17 209 lb (94.8 kg)  Mood -- overall good Tobacco use -- none Alcohol use --- no more than 2 drinks/d  Depression screen [facility] 2/9 09/08/2019  Decreased Interest 0  Down, Depressed, Hopeless 1  PHQ - 2 Score 1  Altered sleeping 1  Tired, decreased energy 0  Change in appetite 0  Feeling bad or failure about yourself  1  Trouble concentrating 1  Moving slowly or fidgety/restless 0  Suicidal thoughts 0  PHQ-9 Score 4  Difficult doing work/chores Somewhat difficult     Other providers/specialists: Patient Care Team: Inda Coke, [place] as PCP - General (Physician Assistant)   PMHx, SurgHx, SocialHx, Medications, and Allergies were reviewed in the Visit Navigator and updated as appropriate.   Past Medical History:  Diagnosis Date  . OA (osteoarthritis) of knee, right      Past Surgical History:  Procedure Laterality Date  .  ANTERIOR CRUCIATE LIGAMENT REPAIR Right      Family History  Problem Relation Age of Onset  . Bladder Cancer Father   . Colon cancer Neg Hx     Social History   Tobacco Use  . Smoking status: Never Smoker  . Smokeless tobacco: Never Used  Substance Use Topics  . Alcohol use: Yes    Alcohol/week: 7.0 - 13.0 standard drinks    Types: 5 - 10 Cans of beer, 2 - 3 Glasses of wine per week  . Drug use: No    Review of Systems:   Review of Systems  Constitutional: Negative for chills, fever, malaise/fatigue and weight loss.  HENT: Negative for hearing loss, sinus pain and sore throat.   Respiratory: Negative for cough and hemoptysis.   Cardiovascular: Negative for chest pain, palpitations, leg swelling and PND.  Gastrointestinal: Negative for abdominal pain, constipation, diarrhea, heartburn, nausea and vomiting.  Genitourinary: Negative for dysuria, frequency and urgency.  Musculoskeletal: Negative for back pain, myalgias and neck pain.  Skin: Positive for itching and rash.  Neurological: Negative for dizziness, tingling, seizures and headaches.  Endo/Heme/Allergies: Negative for polydipsia.  Psychiatric/Behavioral: Negative for depression. The patient is not nervous/anxious.       Objective:   Vitals:   09/08/19 0818  BP: 120/78  Pulse: 61  Temp: (!) 97.2 F (36.2 C)  SpO2: 97%   Body mass index is 25.61 kg/m.  General Appearance:  Alert, cooperative, no distress, appears stated age  Head:  Normocephalic, without obvious abnormality, atraumatic  Eyes:  PERRL, conjunctiva/corneas clear, EOM's intact, fundi benign, both eyes       Ears:  Normal TM's and external ear canals, both ears  Nose: Nares normal, septum midline, mucosa normal, no drainage    or sinus tenderness  Throat: Lips, mucosa, and tongue normal; teeth and gums normal  Neck: Supple, symmetrical, trachea midline, no adenopathy; thyroid:  No enlargement/tenderness/nodules; no carotit bruit or JVD  Back:    Symmetric, no curvature, ROM normal, no CVA tenderness  Lungs:   Clear to auscultation bilaterally, respirations unlabored  Chest wall:  No tenderness or deformity  Heart:  Regular rate and rhythm, S1 and S2 normal, no murmur, rub   or gallop  Abdomen:   Soft, non-tender, bowel sounds active all four quadrants, no masses, no organomegaly  Extremities: Extremities normal, atraumatic, no cyanosis or edema  Prostate: Not done.   Skin: Skin color, texture, turgor normal Erythematous patch to R shin, thickened R great toenail with slight yellowing  Lymph nodes: Cervical, supraclavicular, and axillary nodes normal  Neurologic: CNII-XII grossly intact. Normal strength, sensation and reflexes throughout    Assessment/Plan:   Cristian was seen today for annual exam.  Diagnoses and all orders for this visit:  Routine physical examination Today patient counseled on age appropriate routine health concerns for screening and prevention, each reviewed and up to date or declined. Immunizations reviewed and up to date or declined. Labs ordered and reviewed. Risk factors for depression reviewed and negative. Hearing function and visual acuity are intact. ADLs screened and addressed as needed. Functional ability and level of safety reviewed and appropriate. Education, counseling and referrals performed based on assessed risks today. Patient provided with a copy of personalized plan for preventive services. -     CBC with Differential/Platelet -     Comprehensive metabolic panel  Anxiety Currently stable. Declines need for zoloft 25 mg increase due to fear of side effects of additional weight gain. Continue to monitor, follow-up in 6 months, sooner if concerns. -     CBC with Differential/Platelet -     Comprehensive metabolic panel  Other male erectile dysfunction Controlled with prn Cialis.  Rash Suspect possible fungal component. Will start topical ketoconazole and then have him follow-up if symptoms  worsen or persist. Also provided toenail fungus home remedy. Also recommended that he always change out of sweaty clothes right away, shower, and fully dry off before putting on new clothes.  Encounter for lipid screening for cardiovascular disease -     Lipid panel  Other orders -     ketoconazole (NIZORAL) 2 % cream; Apply to affected area 1-2 times daily    Well Adult Exam: Labs ordered: Yes. Patient counseling was done. See below for items discussed. Discussed the patient's BMI.  The BMI is in the acceptable range Follow up in 6 months.  Patient Counseling: [x]   Nutrition: Stressed importance of moderation in sodium/caffeine intake, saturated fat and cholesterol, caloric balance, sufficient intake of fresh fruits, vegetables, and fiber.  [x]   Stressed the importance of regular exercise.   []   Substance Abuse: Discussed cessation/primary prevention of tobacco, alcohol, or other drug use; driving or other dangerous activities under the influence; availability of treatment for abuse.   [x]   Injury prevention: Discussed safety belts, safety helmets, smoke detector, smoking near bedding or upholstery.   []   Sexuality: Discussed sexually transmitted diseases, partner selection, use of  condoms, avoidance of unintended pregnancy  and contraceptive alternatives.   [x]   Dental health: Discussed importance of regular tooth brushing, flossing, and dental visits.  [x]   Health maintenance and immunizations reviewed. Please refer to Health maintenance section.    CMA or LPN served as scribe during this visit. History, Physical, and Plan performed by medical provider. The above documentation has been reviewed and is accurate and complete.  Inda Coke, PA-C [place]

## 2019-09-21 MED FILL — buPROPion HCL ER (XL) 150 M: 150 | 90 days supply | Qty: 90 | Fill #0

## 2019-09-21 MED FILL — buPROPion HCL ER (XL) 150 M: 150 | 90 days supply | Qty: 90 | Fill #0 | Status: TO

## 2019-09-27 MED FILL — buPROPion HCL ER (XL) 150 M: 150 | 90 days supply | Qty: 90 | Fill #0

## 2019-10-02 ENCOUNTER — Other Ambulatory Visit: Payer: Self-pay | Admitting: Physician Assistant

## 2019-10-04 MED FILL — SERTRALINE HCL 25 MG TABLET: 25 | 30 days supply | Qty: 30 | Fill #0

## 2019-10-15 ENCOUNTER — Other Ambulatory Visit: Payer: Self-pay | Admitting: Family Medicine

## 2019-10-15 DIAGNOSIS — J301 Allergic rhinitis due to pollen: Secondary | ICD-10-CM

## 2019-10-18 ENCOUNTER — Other Ambulatory Visit: Payer: Self-pay | Admitting: Physician Assistant

## 2019-10-18 NOTE — Telephone Encounter (Signed)
Please review

## 2019-10-19 ENCOUNTER — Encounter: Payer: Self-pay | Admitting: Physician Assistant

## 2019-10-19 ENCOUNTER — Other Ambulatory Visit: Payer: Self-pay | Admitting: *Deleted

## 2019-10-19 DIAGNOSIS — J301 Allergic rhinitis due to pollen: Secondary | ICD-10-CM

## 2019-10-19 MED ORDER — FLUTICASONE PROPIONATE 50 MCG/ACT NA SUSP: 2.0000 | Freq: Every day | NASAL | 6 refills | Status: DC

## 2019-10-19 MED FILL — FLUTICASONE PROP 50 MCG SPR: 50 | 30 days supply | Qty: 16 | Fill #0

## 2019-10-22 ENCOUNTER — Other Ambulatory Visit: Payer: Self-pay | Admitting: Family Medicine

## 2019-10-22 DIAGNOSIS — N528 Other male erectile dysfunction: Secondary | ICD-10-CM

## 2019-10-22 MED FILL — SERTRALINE HCL 25 MG TABLET: 25 | 30 days supply | Qty: 30 | Fill #0

## 2019-10-25 ENCOUNTER — Encounter: Payer: Self-pay | Admitting: Physician Assistant

## 2019-10-25 DIAGNOSIS — N528 Other male erectile dysfunction: Secondary | ICD-10-CM

## 2019-10-25 MED ORDER — TADALAFIL 20 MG PO TABS: 10.0000 mg | ORAL_TABLET | ORAL | 5 refills | Status: DC | PRN

## 2019-10-25 NOTE — Telephone Encounter (Signed)
Please review

## 2019-11-29 ENCOUNTER — Other Ambulatory Visit: Payer: Self-pay | Admitting: Physician Assistant

## 2019-11-29 MED FILL — SERTRALINE HCL 25 MG TABLET: 25 | 30 days supply | Qty: 30 | Fill #0

## 2019-12-03 ENCOUNTER — Ambulatory Visit (INDEPENDENT_AMBULATORY_CARE_PROVIDER_SITE_OTHER): Payer: No Typology Code available for payment source | Admitting: Physician Assistant

## 2019-12-03 ENCOUNTER — Other Ambulatory Visit: Payer: Self-pay

## 2019-12-03 ENCOUNTER — Encounter: Payer: Self-pay | Admitting: Physician Assistant

## 2019-12-03 VITALS — BP 110/70 | HR 60 | Temp 97.7°F | Ht 77.0 in | Wt 215.4 lb

## 2019-12-03 DIAGNOSIS — M25572 Pain in left ankle and joints of left foot: Secondary | ICD-10-CM

## 2019-12-03 DIAGNOSIS — B351 Tinea unguium: Secondary | ICD-10-CM

## 2019-12-03 MED ORDER — TERBINAFINE HCL 250 MG PO TABS: 250.0000 mg | ORAL_TABLET | Freq: Every day | ORAL | 0 refills | Status: AC

## 2019-12-03 MED FILL — TERBINAFINE HCL 250 MG TAB: 250 | 84 days supply | Qty: 84 | Fill #0

## 2019-12-03 NOTE — Patient Instructions (Signed)
It was great to see you!  Terbinafine shouldn't interfere with your medications -- I have sent this in. We can recheck your liver functions at the end of your course. We do not need to check them today since we done recently and were normal.  A referral has been placed for you to see Dr. Lynne Leader with [facility]. Someone from there office will be in touch soon regarding your appointment with him. His location: [place] at [facility] [address] on the 1st floor.   Phone number [phone], Fax [phone].  This location is across the street from the entrance to [employer] and in the same complex as the [facility] and [employer] bank    Take care,  Inda Coke PA-C

## 2019-12-03 NOTE — Progress Notes (Signed)
Cristian Odom is a 44 y.o. male here for a new problem.  I acted as a Education administrator for [employer], PA-C Anselmo Pickler, LPN   History of Present Illness:   Chief Complaint  Patient presents with  . Ankle Pain    HPI   Ankle pain Pt c/o left ankle pain and edema x 2-3 days. Pt says it hurts when flexing foot up and down. Pulses intact and foot warm to touch. He does not recall injuring his foot or ankle. Has hx of ankle sprain but nothing significant, no prior history of gout. Has not tried anything for this. Able to bear weight without concerns.  Toenail fungus Tried topical cream without success. L great toe. Feels like it is not improving with time. Recently has LFTs with [place] for CPE and they were normal. He is interested in starting oral medication.   Past Medical History:  Diagnosis Date  . OA (osteoarthritis) of knee, right      Social History   Socioeconomic History  . Marital status: Married    Spouse name: Not on file  . Number of children: Not on file  . Years of education: Not on file  . Highest education level: Not on file  Occupational History  . Not on file  Tobacco Use  . Smoking status: Never Smoker  . Smokeless tobacco: Never Used  Substance and Sexual Activity  . Alcohol use: Yes    Alcohol/week: 7.0 - 13.0 standard drinks    Types: 5 - 10 Cans of beer, 2 - 3 Glasses of wine per week  . Drug use: No  . Sexual activity: Not on file  Other Topics Concern  . Not on file  Social History Narrative   Previously played basketball. Right knee injury that led to arthroscopic clean-out and ACL repair. Bothers him enough to stop him from running. Has Orthopedist at [employer]. Wife is an Therapist, sports. Married in 2017. Combined family with four children, adolescents. Works as Clinical cytogeneticist.       Social Determinants of Health   Financial Resource Strain:   . Difficulty of Paying Living Expenses:   Food Insecurity:   . Worried About Charity fundraiser in the  Last Year:   . Arboriculturist in the Last Year:   Transportation Needs:   . Film/video editor (Medical):   Marland Kitchen Lack of Transportation (Non-Medical):   Physical Activity:   . Days of Exercise per Week:   . Minutes of Exercise per Session:   Stress:   . Feeling of Stress :   Social Connections:   . Frequency of Communication with Friends and Family:   . Frequency of Social Gatherings with Friends and Family:   . Attends Religious Services:   . Active Member of Clubs or Organizations:   . Attends Archivist Meetings:   Marland Kitchen Marital Status:   Intimate Partner Violence:   . Fear of Current or Ex-Partner:   . Emotionally Abused:   Marland Kitchen Physically Abused:   . Sexually Abused:     Past Surgical History:  Procedure Laterality Date  . ANTERIOR CRUCIATE LIGAMENT REPAIR Right     Family History  Problem Relation Age of Onset  . Bladder Cancer Father   . Colon cancer Neg Hx     No Known Allergies  Current Medications:   Current Outpatient Medications:  .  ALPRAZolam (XANAX) 0.5 MG tablet, Take 1 tablet (0.5 mg total) by mouth 2 (two)  times daily as needed for anxiety., Disp: 30 tablet, Rfl: 0 .  buPROPion (WELLBUTRIN XL) 150 MG 24 hr tablet, Take 1 tablet (150 mg total) by mouth daily., Disp: 90 tablet, Rfl: 3 .  Cetirizine HCl 10 MG CAPS, Take 10 mg by mouth daily. , Disp: , Rfl:  .  fluticasone (FLONASE) 50 MCG/ACT nasal spray, Place 2 sprays into both nostrils daily., Disp: 16 g, Rfl: 6 .  MAGNESIUM CITRATE PO, Take 1 capsule by mouth daily., Disp: , Rfl:  .  Omega-3 Fatty Acids (FISH OIL PO), Take 2 capsules by mouth daily., Disp: , Rfl:  .  ondansetron (ZOFRAN ODT) 4 MG disintegrating tablet, Take 1 tablet (4 mg total) by mouth every 8 (eight) hours as needed for nausea or vomiting., Disp: 20 tablet, Rfl: 0 .  sertraline (ZOLOFT) 25 MG tablet, TAKE 1 TABLET (25 MG TOTAL) BY MOUTH DAILY., Disp: 30 tablet, Rfl: 5 .  tadalafil (CIALIS) 20 MG tablet, Take 0.5-1 tablets  (10-20 mg total) by mouth every other day as needed for erectile dysfunction., Disp: 5 tablet, Rfl: 5 .  terbinafine (LAMISIL) 250 MG tablet, Take 1 tablet (250 mg total) by mouth daily., Disp: 84 tablet, Rfl: 0   Review of Systems:   ROS  Negative unless otherwise specified per HPI.  Vitals:   Vitals:   12/03/19 0808  BP: 110/70  Pulse: 60  Temp: 97.7 F (36.5 C)  TempSrc: Temporal  SpO2: 98%  Weight: 215 lb 6.1 oz (97.7 kg)  Height: 6\' 5"  (1.956 m)     Body mass index is 25.54 kg/m.  Physical Exam:   Physical Exam Vitals and nursing note reviewed.  Constitutional:      Appearance: He is well-developed.  HENT:     Head: Normocephalic.  Eyes:     Conjunctiva/sclera: Conjunctivae normal.     Pupils: Pupils are equal, round, and reactive to light.  Cardiovascular:     Pulses:          Dorsalis pedis pulses are 2+ on the right side and 2+ on the left side.       Posterior tibial pulses are 2+ on the right side and 2+ on the left side.  Pulmonary:     Effort: Pulmonary effort is normal.  Musculoskeletal:        General: Normal range of motion.     Cervical back: Normal range of motion.     Comments: Upper lateral L ankle with slight edema. "grinding-like sensation" to area when flexes and extends ankle.  Thickened distal nail edge to L great toe  Skin:    General: Skin is warm and dry.  Neurological:     Mental Status: He is alert and oriented to person, place, and time.     Comments: Normal sensation to b/l LE  Psychiatric:        Behavior: Behavior normal.        Thought Content: Thought content normal.        Judgment: Judgment normal.       Assessment and Plan:   Cristian was seen today for ankle pain.  Diagnoses and all orders for this visit:  Acute left ankle pain Unclear etiology. Recommend avoiding aggressive activities and referral with sports medicine. Patient is agreeable. Anti-inflammatories prn. No clear indication to immobolize patient.  Worsening precautions advised. -     Ambulatory referral to Sports Medicine  Toenail fungus Will trial oral lamisil. Baseline LFTs are WNL. Will order LFTs at  completion of therapy.  Other orders -     terbinafine (LAMISIL) 250 MG tablet; Take 1 tablet (250 mg total) by mouth daily.  . Reviewed expectations re: course of current medical issues. . Discussed self-management of symptoms. . Outlined signs and symptoms indicating need for more acute intervention. . Patient verbalized understanding and all questions were answered. . See orders for this visit as documented in the electronic medical record. . Patient received an After-Visit Summary.  CMA or LPN served as scribe during this visit. History, Physical, and Plan performed by medical provider. The above documentation has been reviewed and is accurate and complete.  Inda Coke, PA-C

## 2019-12-06 MED FILL — FLUTICASONE PROP 50 MCG SPR: 50 | 30 days supply | Qty: 16 | Fill #1

## 2019-12-14 ENCOUNTER — Encounter: Payer: Self-pay | Admitting: Family Medicine

## 2019-12-14 ENCOUNTER — Ambulatory Visit (INDEPENDENT_AMBULATORY_CARE_PROVIDER_SITE_OTHER): Payer: No Typology Code available for payment source | Admitting: Family Medicine

## 2019-12-14 ENCOUNTER — Ambulatory Visit: Payer: Self-pay

## 2019-12-14 ENCOUNTER — Other Ambulatory Visit: Payer: Self-pay

## 2019-12-14 ENCOUNTER — Ambulatory Visit (INDEPENDENT_AMBULATORY_CARE_PROVIDER_SITE_OTHER): Payer: No Typology Code available for payment source

## 2019-12-14 VITALS — BP 104/62 | HR 62 | Ht 77.0 in | Wt 219.2 lb

## 2019-12-14 DIAGNOSIS — M25572 Pain in left ankle and joints of left foot: Secondary | ICD-10-CM

## 2019-12-14 NOTE — Progress Notes (Signed)
Subjective:    I'm seeing this patient as a consultation for:  Len Blalock, [place]. Note will be routed back to referring provider/PCP.  CC: L ankle pain  I, Molly Weber, LAT, ATC, am serving as scribe for Dr. Lynne Leader.  HPI: Pt is a 44 y/o male presenting w/ c/o L ankle pain and swelling x approximately 4 weeks w/ no known MOI.  Pt locates his pain to his L anterior-lateral ankle and lower leg.  He is fairly active going to the gym and walking every day.  He rates his pain as mod-severe and describes his pain as aching.  Patient is fit and active exercising regularly with walking and going to the gym a few times a week.  His left ankle swelling is somewhat obnoxious and starting to interfere with his exercise.  He cannot recall any injury or recent period of immobility.  No personal or family history of DVT or gout.    Radiating pain: yes into the L distal lower leg L ankle swelling: yes L ankle mechanical symptoms: No Aggravating factors: ankle DF/PF AROM, particularly eccentric DF; descending stairs Treatments tried: IBU, compression wrap  Past medical history, Surgical history, Family history, Social history, Allergies, and medications have been entered into the medical record, reviewed.   Review of Systems: No new headache, visual changes, nausea, vomiting, diarrhea, constipation, dizziness, abdominal pain, skin rash, fevers, chills, night sweats, weight loss, swollen lymph nodes, body aches, joint swelling, muscle aches, chest pain, shortness of breath, mood changes, visual or auditory hallucinations.   Objective:    Vitals:   12/14/19 0804  BP: 104/62  Pulse: 62  SpO2: 98%   General: Well Developed, well nourished, and in no acute distress.  Neuro/Psych: Alert and oriented x3, extra-ocular muscles intact, able to move all 4 extremities, sensation grossly intact. Skin: Warm and dry, no rashes noted.  Respiratory: Not using accessory muscles, speaking in full sentences, trachea  midline.  Cardiovascular: Pulses palpable, no extremity edema. Abdomen: Does not appear distended. MSK:  Left ankle mild nonpitting edema.  Edema extends a few centimeters above the talus but not much into the calf.   Not particularly tender to palpation. Some palpable squeak overlying anterior ankle with ankle dorsiflexion and toe dorsiflexion. Normal ankle motion. Strength is intact without much pain. Slight laxity to anterior drawer test.  No significant laxity to talar tilt test. Pulses capillary fill and sensation are intact distally. Onychomycosis present bilateral toes  Contralateral right ankle with no edema. Nontender normal ankle motion. Strength and stability testing normal. Onychomycosis present toes bilaterally.  Lab and Radiology Results  X-ray images left ankle obtained today personally and independently reviewed Degenerative changes mildly present at the talus and ankle mortise. Accessory ossicle versus old rounded avulsion present at medial ankle.  No acute fractures. Await formal radiology review  Diagnostic Limited MSK Ultrasound of: Left ankle Achilles tendon normal-appearing intact. Trace retrocalcaneal bursa present. Medial ankle reveals intact posterior tibialis tendon flexor digitorum tendons and normal vascular and nerve structures at tarsal tunnel. Soft tissue fluid tracking subcutaneous space superficially. Irregular appearance medial ankle joint line with prominence overlying the talus. Lateral joint line again soft tissue hypoechoic fluid tracking subcutaneous space. Peroneal tendons normal appearing Irregular appearance joint line with appearance tiny avulsion flecks originating from distal fibula and talus. Anterior ankle reveals normal-appearing tendinous structures. Slight hypoechoic fluid tracking within extensor digitorum tendon sheath without pain to ultrasound probe palpation. Again soft tissue swelling hypoechoic fluid tracking subcutaneous  space. Soft tissue subcutaneous hypoechoic fluid consistent appearance with edema. Impression: Degenerative changes evidence of old avulsion injuries.  No significant tendon injury or tendinopathy visible. Most dominant finding is soft tissue hypoechoic fluid tracking consistent with edema.   Impression and Recommendations:    Assessment and Plan: 44 y.o. male with left ankle swelling with some pain ongoing for about a month. Patient does have a bit of laxity to anterior drawer testing indicating old ankle sprain and likely ATFL laxity or absence. He does have degenerative changes seen on x-ray per my read however radiology overread is still pending.  Additionally degenerative changes seen on ultrasound examination. Most dominant finding however is swelling which is more consistent with soft tissue edema than tenosynovitis or joint effusion. Plan treat pragmatically and proceed with further work-up.  We will proceed with vascular ultrasound to rule out DVT.  Additionally will proceed with home exercise program to focus on ankle stability and tendon strengthening as taught in clinic today by ATC. Trial Voltaren gel and compressive ankle sleeve or potentially compression hose. Recheck back in a month.  If not improving and if no DVT would proceed with venous reflux study as next step.  [id number]; 15 additional minutes spent for Therapeutic exercises as stated in above notes.  This included exercises focusing on stretching, strengthening, with significant focus on eccentric aspects.   Long term goals include an improvement in range of motion, strength, endurance as well as avoiding reinjury. Patient's frequency would include in 1-2 times a day, 3-5 times a week for a duration of 6-12 weeks.  Proper technique shown and discussed handout in great detail with ATC.  All questions were discussed and answered.   Orders Placed This Encounter  Procedures  . [place] LIMITED JOINT SPACE STRUCTURES LOW LEFT(NO LINKED  CHARGES)    Order Specific Question:   Reason for Exam (SYMPTOM  OR DIAGNOSIS REQUIRED)    Answer:   L ankle pain    Order Specific Question:   Preferred imaging location?    Answer:   [facility]  . DG Ankle Complete Left    Standing Status:   Future    Number of Occurrences:   1    Standing Expiration Date:   02/12/2021    Order Specific Question:   Reason for Exam (SYMPTOM  OR DIAGNOSIS REQUIRED)    Answer:   eval left ankle pain and swelling    Order Specific Question:   Preferred imaging location?    Answer:   [facility]    Order Specific Question:   Radiology Contrast Protocol - do NOT remove file path    Answer:   [email or web address]   No orders of the defined types were placed in this encounter.   Discussed warning signs or symptoms. Please see discharge instructions. Patient expresses understanding.   The above documentation has been reviewed and is accurate and complete Lynne Leader

## 2019-12-14 NOTE — Progress Notes (Signed)
X-ray ankle shows no acute fractures.  It does show an accessory ossicle at the inside part of the ankle where you are not having much pain.  Otherwise look pretty normal to radiology.  I think there is a little bit of arthritis there as well but the radiologist did not see much.

## 2019-12-14 NOTE — Patient Instructions (Addendum)
Thank you for coming in today. Use body helix ankle compression sleeve.  During and follow activity.  Use voltaren gel on the ankle up to 4x daily.  Consider calf high compression stocking.  Start exercises Molly reviewed with you.   Please perform the exercise program that we have prepared for you and gone over in detail on a daily basis.  In addition to the handout you were provided you can access your program through: [email or web address]   Your unique program code is: [email or web address]  I recommend you obtained a compression sleeve to help with your joint problems. There are many options on the market however I recommend obtaining a ankle Body Helix compression sleeve.  You can find information (including how to appropriate measure yourself for sizing) can be found at www.Body http://www.lambert.com/.  Many of these products are health savings account (HSA) eligible.   You can use the compression sleeve at any time throughout the day but is most important to use while being active as well as for 2 hours post-activity.   It is appropriate to ice following activity with the compression sleeve in place.     You should hear about ultrasound to look for DVT.  Get xray on your way out today.   Recheck in 4 weeks.  Return sooner if needed.  Contact me if you have questions or a problem.    Edema  Edema is an abnormal buildup of fluids in the body tissues and under the skin. Swelling of the legs, feet, and ankles is a common symptom that becomes more likely as you get older. Swelling is also common in looser tissues, like around the eyes. When the affected area is squeezed, the fluid may move out of that spot and leave a dent for a few moments. This dent is called pitting edema. There are many possible causes of edema. Eating too much salt (sodium) and being on your feet or sitting for a long time can cause edema in your legs, feet, and ankles. Hot weather may make edema worse. Common causes of edema  include:  Heart failure.  Liver or kidney disease.  Weak leg blood vessels.  Cancer.  An injury.  Pregnancy.  Medicines.  Being obese.  Low protein levels in the blood. Edema is usually painless. Your skin may look swollen or shiny. Follow these instructions at home:  Keep the affected body part raised (elevated) above the level of your heart when you are sitting or lying down.  Do not sit still or stand for long periods of time.  Do not wear tight clothing. Do not wear garters on your upper legs.  Exercise your legs to get your circulation going. This helps to move the fluid back into your blood vessels, and it may help the swelling go down.  Wear elastic bandages or support stockings to reduce swelling as told by your health care provider.  Eat a low-salt (low-sodium) diet to reduce fluid as told by your health care provider.  Depending on the cause of your swelling, you may need to limit how much fluid you drink (fluid restriction).  Take over-the-counter and prescription medicines only as told by your health care provider. Contact a health care provider if:  Your edema does not get better with treatment.  You have heart, liver, or kidney disease and have symptoms of edema.  You have sudden and unexplained weight gain. Get help right away if:  You develop shortness of breath or chest pain.  You cannot  breathe when you lie down.  You develop pain, redness, or warmth in the swollen areas.  You have heart, liver, or kidney disease and suddenly get edema.  You have a fever and your symptoms suddenly get worse. Summary  Edema is an abnormal buildup of fluids in the body tissues and under the skin.  Eating too much salt (sodium) and being on your feet or sitting for a long time can cause edema in your legs, feet, and ankles.  Keep the affected body part raised (elevated) above the level of your heart when you are sitting or lying down. This information is  not intended to replace advice given to you by your health care provider. Make sure you discuss any questions you have with your health care provider. Document Revised: 12/09/2018 Document Reviewed: 08/24/2016 Elsevier Patient Education  [place].

## 2019-12-24 ENCOUNTER — Inpatient Hospital Stay (HOSPITAL_COMMUNITY): Admission: RE | Admit: 2019-12-24 | Payer: No Typology Code available for payment source | Source: Ambulatory Visit

## 2019-12-27 MED FILL — buPROPion HCL ER (XL) 150 M: 150 | 90 days supply | Qty: 90 | Fill #1

## 2019-12-27 MED FILL — SERTRALINE HCL 25 MG TABLET: 25 | 30 days supply | Qty: 30 | Fill #1

## 2020-01-11 ENCOUNTER — Ambulatory Visit: Payer: No Typology Code available for payment source | Admitting: Family Medicine

## 2020-01-24 MED FILL — SERTRALINE HCL 25 MG TABLET: 25 | 30 days supply | Qty: 30 | Fill #2

## 2020-02-03 ENCOUNTER — Other Ambulatory Visit: Payer: Self-pay | Admitting: Physician Assistant

## 2020-02-03 ENCOUNTER — Encounter: Payer: Self-pay | Admitting: Physician Assistant

## 2020-02-03 ENCOUNTER — Other Ambulatory Visit: Payer: Self-pay | Admitting: *Deleted

## 2020-02-03 DIAGNOSIS — N528 Other male erectile dysfunction: Secondary | ICD-10-CM

## 2020-02-03 MED ORDER — ONDANSETRON 4 MG PO TBDP: 4.0000 mg | ORAL_TABLET | Freq: Three times a day (TID) | ORAL | 0 refills | Status: DC | PRN

## 2020-02-03 MED ORDER — TADALAFIL 20 MG PO TABS: 10.0000 mg | ORAL_TABLET | ORAL | 5 refills | Status: DC | PRN

## 2020-02-03 MED FILL — ONDANSETRON ODT 4 MG TABLET: 4 | 7 days supply | Qty: 20 | Fill #0

## 2020-02-09 ENCOUNTER — Telehealth: Payer: No Typology Code available for payment source | Admitting: Family

## 2020-02-09 ENCOUNTER — Other Ambulatory Visit: Payer: Self-pay

## 2020-02-09 ENCOUNTER — Encounter: Payer: Self-pay | Admitting: Physician Assistant

## 2020-02-09 ENCOUNTER — Other Ambulatory Visit: Payer: No Typology Code available for payment source

## 2020-02-09 ENCOUNTER — Telehealth (INDEPENDENT_AMBULATORY_CARE_PROVIDER_SITE_OTHER): Payer: No Typology Code available for payment source | Admitting: Family

## 2020-02-09 ENCOUNTER — Encounter: Payer: Self-pay | Admitting: Family

## 2020-02-09 DIAGNOSIS — R110 Nausea: Secondary | ICD-10-CM

## 2020-02-09 DIAGNOSIS — K219 Gastro-esophageal reflux disease without esophagitis: Secondary | ICD-10-CM | POA: Diagnosis not present

## 2020-02-09 MED ORDER — OMEPRAZOLE 40 MG PO CPDR: 40.0000 mg | DELAYED_RELEASE_CAPSULE | Freq: Every day | ORAL | 3 refills | Status: DC

## 2020-02-09 MED ORDER — AZITHROMYCIN 250 MG PO TABS: 250.0000 mg | ORAL_TABLET | Freq: Every day | ORAL | 0 refills | Status: DC

## 2020-02-09 NOTE — Addendum Note (Signed)
Addended by: Doran Clay A on: 02/09/2020 03:59 PM   Modules accepted: Orders

## 2020-02-09 NOTE — Progress Notes (Signed)
Virtual Visit via Video   I connected with patient on 02/09/20 at  3:20 PM EDT by a video enabled telemedicine application and verified that I am speaking with the correct person using two identifiers.  Location patient: Home Location provider: [facility] Persons participating in the virtual visit: Patient, Provider, CMA  I discussed the limitations of evaluation and management by telemedicine and the availability of in person appointments. The patient expressed understanding and agreed to proceed.  Subjective:   HPI:  Patient is in today for with c/o nausea and diarrhea x 8 days that has not improved. Also has a slight burning sensation in his stomach. Has tried anti-diarrheal meds and Zofran without much relief. Has been grilling more lately. Recalls cooking chicken on the grill that may not have been completely done but he put it in the over to finish cooking. Consumes spicy foods. Not many fried foods. Drinks mostly water but feels he may be dehydrated. No blood in the stool.    ROS:   See pertinent positives and negatives per HPI.  Patient Active Problem List   Diagnosis Date Noted  . Chronic pain of right knee 07/21/2018  . Anxiety 11/10/2013  . AR (allergic rhinitis) 11/10/2013    Social History   Tobacco Use  . Smoking status: Never Smoker  . Smokeless tobacco: Never Used  Substance Use Topics  . Alcohol use: Yes    Alcohol/week: 7.0 - 13.0 standard drinks    Types: 5 - 10 Cans of beer, 2 - 3 Glasses of wine per week    Current Outpatient Medications:  .  buPROPion (WELLBUTRIN XL) 150 MG 24 hr tablet, Take 1 tablet (150 mg total) by mouth daily., Disp: 90 tablet, Rfl: 3 .  Cetirizine HCl 10 MG CAPS, Take 10 mg by mouth daily. , Disp: , Rfl:  .  fluticasone (FLONASE) 50 MCG/ACT nasal spray, Place 2 sprays into both nostrils daily., Disp: 16 g, Rfl: 6 .  MAGNESIUM CITRATE PO, Take 1 capsule by mouth daily., Disp: , Rfl:  .  Omega-3 Fatty Acids (FISH  OIL PO), Take 2 capsules by mouth daily., Disp: , Rfl:  .  ondansetron (ZOFRAN ODT) 4 MG disintegrating tablet, Take 1 tablet (4 mg total) by mouth every 8 (eight) hours as needed for nausea or vomiting., Disp: 20 tablet, Rfl: 0 .  sertraline (ZOLOFT) 25 MG tablet, TAKE 1 TABLET (25 MG TOTAL) BY MOUTH DAILY., Disp: 30 tablet, Rfl: 5 .  tadalafil (CIALIS) 20 MG tablet, Take 0.5-1 tablets (10-20 mg total) by mouth every other day as needed for erectile dysfunction., Disp: 5 tablet, Rfl: 5 .  azithromycin (ZITHROMAX Z-PAK) 250 MG tablet, Take 1 tablet (250 mg total) by mouth daily. As directed, Disp: 6 tablet, Rfl: 0 .  omeprazole (PRILOSEC) 40 MG capsule, Take 1 capsule (40 mg total) by mouth daily., Disp: 30 capsule, Rfl: 3 .  terbinafine (LAMISIL) 250 MG tablet, Take 1 tablet (250 mg total) by mouth daily. (Patient not taking: Reported on 02/09/2020), Disp: 84 tablet, Rfl: 0  No Known Allergies  Objective:   There were no vitals taken for this visit.  Patient is well-developed, well-nourished in no acute distress.  Resting comfortably at home.  Head is normocephalic, atraumatic.  No labored breathing.  Speech is clear and coherent with logical content.  Patient is alert and oriented at baseline.   Assessment and Plan:    Cristian Odom was seen today for nausea.  Diagnoses and all orders  for this visit:  Nausea  Gastroesophageal reflux disease, unspecified whether esophagitis present -     H. pylori breath test; Future  Other orders -     omeprazole (PRILOSEC) 40 MG capsule; Take 1 capsule (40 mg total) by mouth daily. -     azithromycin (ZITHROMAX Z-PAK) 250 MG tablet; Take 1 tablet (250 mg total) by mouth daily. As directed   Patient will have labs drawn at [facility]. Will follow-up pending results and sooner as needed  Kennyth Arnold, FNP 02/09/2020

## 2020-02-09 NOTE — Patient Instructions (Signed)

## 2020-02-10 ENCOUNTER — Telehealth: Payer: Self-pay | Admitting: Family

## 2020-02-10 LAB — H. PYLORI BREATH TEST: H. pylori Breath Test: NOT DETECTED

## 2020-02-10 NOTE — Telephone Encounter (Signed)
Patient had video visit with Dutch Quint yesterday and had medications sent to Cristian Odom. However, today he would like them transferred to [facility] [place], [place]. Ph [phone]. Azithromycin and Omeprazole.

## 2020-02-28 MED FILL — SERTRALINE HCL 25 MG TABLET: 25 | 30 days supply | Qty: 30 | Fill #3

## 2020-03-28 MED FILL — buPROPion HCL ER (XL) 150 M: 150 | 90 days supply | Qty: 90 | Fill #0

## 2020-03-28 MED FILL — SERTRALINE HCL 25 MG TABLET: 25 | 30 days supply | Qty: 30 | Fill #0

## 2020-04-29 MED FILL — SERTRALINE HCL 25 MG TABLET: 25 | 30 days supply | Qty: 30 | Fill #1

## 2020-05-15 ENCOUNTER — Other Ambulatory Visit: Payer: Self-pay | Admitting: Physician Assistant

## 2020-05-15 ENCOUNTER — Encounter: Payer: Self-pay | Admitting: Physician Assistant

## 2020-05-15 ENCOUNTER — Other Ambulatory Visit: Payer: Self-pay

## 2020-05-15 DIAGNOSIS — N528 Other male erectile dysfunction: Secondary | ICD-10-CM

## 2020-05-15 MED ORDER — TADALAFIL 20 MG PO TABS: ORAL_TABLET | ORAL | 5 refills | Status: DC

## 2020-05-27 ENCOUNTER — Other Ambulatory Visit: Payer: Self-pay | Admitting: Physician Assistant

## 2020-05-28 ENCOUNTER — Other Ambulatory Visit: Payer: Self-pay | Admitting: Physician Assistant

## 2020-05-29 MED FILL — SERTRALINE HCL 25 MG TABLET: 25 | 30 days supply | Qty: 30 | Fill #0

## 2020-06-26 ENCOUNTER — Other Ambulatory Visit: Payer: Self-pay | Admitting: Physician Assistant

## 2020-06-26 DIAGNOSIS — F419 Anxiety disorder, unspecified: Secondary | ICD-10-CM

## 2020-06-26 MED FILL — buPROPion HCL ER (XL) 150 M: 150 | 30 days supply | Qty: 30 | Fill #0

## 2020-06-26 MED FILL — SERTRALINE HCL 25 MG TABLET: 25 | 30 days supply | Qty: 30 | Fill #1

## 2020-06-27 MED FILL — FLUTICASONE PROP 50 MCG SPR: 50 | 30 days supply | Qty: 16 | Fill #0

## 2020-07-25 ENCOUNTER — Other Ambulatory Visit: Payer: Self-pay | Admitting: Physician Assistant

## 2020-07-25 DIAGNOSIS — F419 Anxiety disorder, unspecified: Secondary | ICD-10-CM

## 2020-07-25 MED FILL — SERTRALINE HCL 25 MG TABLET: 25 | 30 days supply | Qty: 30 | Fill #0

## 2020-07-25 MED FILL — buPROPion HCL ER (XL) 150 M: 150 | 30 days supply | Qty: 30 | Fill #0

## 2020-08-21 MED FILL — buPROPion HCL ER (XL) 150 M: 150 | 30 days supply | Qty: 30 | Fill #1

## 2020-08-21 MED FILL — SERTRALINE HCL 25 MG TABLET: 25 | 30 days supply | Qty: 30 | Fill #1

## 2020-09-18 ENCOUNTER — Other Ambulatory Visit (HOSPITAL_COMMUNITY): Payer: Self-pay | Admitting: Physician Assistant

## 2020-09-18 ENCOUNTER — Encounter: Payer: Self-pay | Admitting: Physician Assistant

## 2020-09-18 ENCOUNTER — Other Ambulatory Visit: Payer: Self-pay | Admitting: Physician Assistant

## 2020-09-18 MED ORDER — ALPRAZOLAM 0.25 MG PO TABS: 0.2500 mg | ORAL_TABLET | Freq: Two times a day (BID) | ORAL | 0 refills | Status: DC | PRN

## 2020-09-18 MED ORDER — ONDANSETRON 4 MG PO TBDP: 4.0000 mg | ORAL_TABLET | Freq: Three times a day (TID) | ORAL | 0 refills | Status: DC | PRN

## 2020-09-18 MED FILL — ONDANSETRON ODT 4 MG TABLET: 4 | 7 days supply | Qty: 20 | Fill #0

## 2020-09-18 MED FILL — ALPRAZolam 0.25 MG TABS: 0.25 | 10 days supply | Qty: 20 | Fill #0

## 2020-09-18 NOTE — Telephone Encounter (Signed)
Patient is going out of town Wednesday and is asking if these can be sent in before he goes.

## 2020-09-27 ENCOUNTER — Other Ambulatory Visit: Payer: Self-pay | Admitting: Physician Assistant

## 2020-09-27 DIAGNOSIS — F419 Anxiety disorder, unspecified: Secondary | ICD-10-CM

## 2020-09-27 MED FILL — buPROPion HCL ER (XL) 150 M: 150 | 30 days supply | Qty: 30 | Fill #0

## 2020-09-27 MED FILL — SERTRALINE HCL 25 MG TABLET: 25 | 30 days supply | Qty: 30 | Fill #0

## 2020-10-10 ENCOUNTER — Telehealth: Payer: Self-pay

## 2020-10-10 ENCOUNTER — Ambulatory Visit (INDEPENDENT_AMBULATORY_CARE_PROVIDER_SITE_OTHER): Payer: No Typology Code available for payment source | Admitting: Physician Assistant

## 2020-10-10 ENCOUNTER — Encounter: Payer: Self-pay | Admitting: Physician Assistant

## 2020-10-10 ENCOUNTER — Other Ambulatory Visit: Payer: Self-pay

## 2020-10-10 ENCOUNTER — Other Ambulatory Visit (HOSPITAL_COMMUNITY): Payer: Self-pay | Admitting: Physician Assistant

## 2020-10-10 VITALS — BP 120/70 | HR 62 | Temp 97.4°F | Ht 75.0 in | Wt 207.5 lb

## 2020-10-10 DIAGNOSIS — Z0001 Encounter for general adult medical examination with abnormal findings: Secondary | ICD-10-CM | POA: Diagnosis not present

## 2020-10-10 DIAGNOSIS — E785 Hyperlipidemia, unspecified: Secondary | ICD-10-CM | POA: Diagnosis not present

## 2020-10-10 DIAGNOSIS — Z1159 Encounter for screening for other viral diseases: Secondary | ICD-10-CM

## 2020-10-10 DIAGNOSIS — Z1211 Encounter for screening for malignant neoplasm of colon: Secondary | ICD-10-CM

## 2020-10-10 DIAGNOSIS — F419 Anxiety disorder, unspecified: Secondary | ICD-10-CM

## 2020-10-10 DIAGNOSIS — R351 Nocturia: Secondary | ICD-10-CM | POA: Diagnosis not present

## 2020-10-10 LAB — CBC WITH DIFFERENTIAL/PLATELET
Basophils Absolute: 0 10*3/uL (ref 0.0–0.1)
Basophils Relative: 0.5 % (ref 0.0–3.0)
Eosinophils Absolute: 0.2 10*3/uL (ref 0.0–0.7)
Eosinophils Relative: 5.7 % — ABNORMAL HIGH (ref 0.0–5.0)
HCT: 43.2 % (ref 39.0–52.0)
Hemoglobin: 14.5 g/dL (ref 13.0–17.0)
Lymphocytes Relative: 27.1 % (ref 12.0–46.0)
Lymphs Abs: 1.1 10*3/uL (ref 0.7–4.0)
MCHC: 33.7 g/dL (ref 30.0–36.0)
MCV: 88.2 fl (ref 78.0–100.0)
Monocytes Absolute: 0.4 10*3/uL (ref 0.1–1.0)
Monocytes Relative: 10.9 % (ref 3.0–12.0)
Neutro Abs: 2.2 10*3/uL (ref 1.4–7.7)
Neutrophils Relative %: 55.8 % (ref 43.0–77.0)
Platelets: 180 10*3/uL (ref 150.0–400.0)
RBC: 4.89 Mil/uL (ref 4.22–5.81)
RDW: 13.4 % (ref 11.5–15.5)
WBC: 4 10*3/uL (ref 4.0–10.5)

## 2020-10-10 LAB — COMPREHENSIVE METABOLIC PANEL
ALT: 23 U/L (ref 0–53)
AST: 27 U/L (ref 0–37)
Albumin: 4.1 g/dL (ref 3.5–5.2)
Alkaline Phosphatase: 53 U/L (ref 39–117)
BUN: 23 mg/dL (ref 6–23)
CO2: 29 mEq/L (ref 19–32)
Calcium: 9.2 mg/dL (ref 8.4–10.5)
Chloride: 105 mEq/L (ref 96–112)
Creatinine, Ser: 1.06 mg/dL (ref 0.40–1.50)
GFR: 83.91 mL/min (ref >60.00)
Glucose, Bld: 88 mg/dL (ref 70–99)
Potassium: 4 mEq/L (ref 3.5–5.1)
Sodium: 140 mEq/L (ref 135–145)
Total Bilirubin: 0.5 mg/dL (ref 0.2–1.2)
Total Protein: 6.6 g/dL (ref 6.0–8.3)

## 2020-10-10 LAB — LIPID PANEL
Cholesterol: 172 mg/dL (ref 0–200)
HDL: 50.2 mg/dL (ref >39.00)
LDL Cholesterol: 110 mg/dL — ABNORMAL HIGH (ref 0–99)
NonHDL: 121.71
Total CHOL/HDL Ratio: 3
Triglycerides: 58 mg/dL (ref 0.0–149.0)
VLDL: 11.6 mg/dL (ref 0.0–40.0)

## 2020-10-10 LAB — PSA: PSA: 0.83 ng/mL (ref 0.10–4.00)

## 2020-10-10 LAB — HEPATITIS C ANTIBODY
Hepatitis C Ab: NONREACTIVE
SIGNAL TO CUT-OFF: 0.01 (ref <1.00)

## 2020-10-10 MED ORDER — ALPRAZOLAM 0.25 MG PO TABS: 0.2500 mg | ORAL_TABLET | Freq: Two times a day (BID) | ORAL | 0 refills | Status: DC | PRN

## 2020-10-10 MED FILL — ALPRAZolam 0.25 MG TABS: 0.25 | 10 days supply | Qty: 20 | Fill #0

## 2020-10-10 NOTE — Telephone Encounter (Signed)
Patient received his moderna covid vaccine on 10/04/19 and the 2nd one on 11/08/19 and received [employer] on 06/16/20.

## 2020-10-10 NOTE — Patient Instructions (Addendum)
It was great to see you!  Consider stopping Wellbutrin completely while maintaining 25 mg zoloft. If you don't like they way that you feel after stopping this, you can resume the Wellbutrin.  Please go to the lab for blood work.   Our office will call you with your results unless you have chosen to receive results via MyChart.  If your blood work is normal we will follow-up each year for physicals and as scheduled for chronic medical problems.  If anything is abnormal we will treat accordingly and get you in for a follow-up.  Take care,  [facility] Maintenance, Male Adopting a healthy lifestyle and getting preventive care are important in promoting health and wellness. Ask your health care provider about:  The right schedule for you to have regular tests and exams.  Things you can do on your own to prevent diseases and keep yourself healthy. What should I know about diet, weight, and exercise? Eat a healthy diet  Eat a diet that includes plenty of vegetables, fruits, low-fat dairy products, and lean protein.  Do not eat a lot of foods that are high in solid fats, added sugars, or sodium.   Maintain a healthy weight Body mass index (BMI) is a measurement that can be used to identify possible weight problems. It estimates body fat based on height and weight. Your health care provider can help determine your BMI and help you achieve or maintain a healthy weight. Get regular exercise Get regular exercise. This is one of the most important things you can do for your health. Most adults should:  Exercise for at least 150 minutes each week. The exercise should increase your heart rate and make you sweat (moderate-intensity exercise).  Do strengthening exercises at least twice a week. This is in addition to the moderate-intensity exercise.  Spend less time sitting. Even light physical activity can be beneficial. Watch cholesterol and blood lipids Have your blood tested for  lipids and cholesterol at 47 years of age, then have this test every 5 years. You may need to have your cholesterol levels checked more often if:  Your lipid or cholesterol levels are high.  You are older than 47 years of age.  You are at high risk for heart disease. What should I know about cancer screening? Many types of cancers can be detected early and may often be prevented. Depending on your health history and family history, you may need to have cancer screening at various ages. This may include screening for:  Colorectal cancer.  Prostate cancer.  Skin cancer.  Lung cancer. What should I know about heart disease, diabetes, and high blood pressure? Blood pressure and heart disease  High blood pressure causes heart disease and increases the risk of stroke. This is more likely to develop in people who have high blood pressure readings, are of African descent, or are overweight.  Talk with your health care provider about your target blood pressure readings.  Have your blood pressure checked: ? Every 3-5 years if you are 67-28 years of age. ? Every year if you are 22 years old or older.  If you are between the ages of 77 and 34 and are a current or former smoker, ask your health care provider if you should have a one-time screening for abdominal aortic aneurysm (AAA). Diabetes Have regular diabetes screenings. This checks your fasting blood sugar level. Have the screening done:  Once every three years after age 52 if you are  at a normal weight and have a low risk for diabetes.  More often and at a younger age if you are overweight or have a high risk for diabetes. What should I know about preventing infection? Hepatitis B If you have a higher risk for hepatitis B, you should be screened for this virus. Talk with your health care provider to find out if you are at risk for hepatitis B infection. Hepatitis C Blood testing is recommended for:  Everyone born from [birth year] through  1965.  Anyone with known risk factors for hepatitis C. Sexually transmitted infections (STIs)  You should be screened each year for STIs, including gonorrhea and chlamydia, if: ? You are sexually active and are younger than 47 years of age. ? You are older than 47 years of age and your health care provider tells you that you are at risk for this type of infection. ? Your sexual activity has changed since you were last screened, and you are at increased risk for chlamydia or gonorrhea. Ask your health care provider if you are at risk.  Ask your health care provider about whether you are at high risk for HIV. Your health care provider may recommend a prescription medicine to help prevent HIV infection. If you choose to take medicine to prevent HIV, you should first get tested for HIV. You should then be tested every 3 months for as long as you are taking the medicine. Follow these instructions at home: Lifestyle  Do not use any products that contain nicotine or tobacco, such as cigarettes, e-cigarettes, and chewing tobacco. If you need help quitting, ask your health care provider.  Do not use street drugs.  Do not share needles.  Ask your health care provider for help if you need support or information about quitting drugs. Alcohol use  Do not drink alcohol if your health care provider tells you not to drink.  If you drink alcohol: ? Limit how much you have to 0-2 drinks a day. ? Be aware of how much alcohol is in your drink. In the [place] one drink equals one 12 oz bottle of beer (355 mL), one 5 oz glass of wine (148 mL), or one 1 oz glass of hard liquor (44 mL). General instructions  Schedule regular health, dental, and eye exams.  Stay current with your vaccines.  Tell your health care provider if: ? You often feel depressed. ? You have ever been abused or do not feel safe at home. Summary  Adopting a healthy lifestyle and getting preventive care are important in promoting  health and wellness.  Follow your health care provider's instructions about healthy diet, exercising, and getting tested or screened for diseases.  Follow your health care provider's instructions on monitoring your cholesterol and blood pressure. This information is not intended to replace advice given to you by your health care provider. Make sure you discuss any questions you have with your health care provider. Document Revised: 07/15/2018 Document Reviewed: 07/15/2018 Elsevier Patient Education  2021 [employer].

## 2020-10-10 NOTE — Progress Notes (Signed)
I acted as a Education administrator for [employer], PA-C [employer], LPN   Subjective:    Cristian Odom is a 45 y.o. male and is here for a comprehensive physical exam.  HPI  Health Maintenance Due  Topic Date Due  . COLONOSCOPY (Pts 45-52yrs Insurance coverage will need to be confirmed)  Never done    Acute Concerns: Nocturia -- has had ongoing nocturia up to 3 times per night over the past year. Denies pelvic pain or pressure. No family hx of prostate cancer.  Chronic Issues: Anxiety -- zoloft 25 mg daily and wellbutrin 150 mg XL daily. Xanax use is rare. Does not currently see a counselor or therapist. Interested in seeing therapist potentially and also possibly decreasing medications.  Health Maintenance: Immunizations -- UTD Colonoscopy -- due; is agreeable PSA -- N/A Diet -- struggles with making good choices; lots of water Caffeine intake -- black coffee Sleep habits -- no significant concerns Exercise -- regular exercise Weight -- Weight: 207 lb 8 oz (94.1 kg)  Weight history Wt Readings from Last 10 Encounters:  10/10/20 207 lb 8 oz (94.1 kg)  12/14/19 219 lb 3.2 oz (99.4 kg)  12/03/19 215 lb 6.1 oz (97.7 kg)  09/08/19 216 lb (98 kg)  05/27/19 210 lb (95.3 kg)  10/20/18 210 lb 4 oz (95.4 kg)  07/21/18 207 lb (93.9 kg)  08/07/17 209 lb (94.8 kg)   Body mass index is 25.94 kg/m. Mood -- stable Tobacco use --  Tobacco Use: Low Risk   . Smoking Tobacco Use: Never Smoker  . Smokeless Tobacco Use: Never Used    Alcohol use ---  reports current alcohol use of about 7.0 - 13.0 standard drinks of alcohol per week. Denies concerns with alcohol intake.  Depression screen [facility] 2/9 10/10/2020  Decreased Interest 0  Down, Depressed, Hopeless 0  PHQ - 2 Score 0  Altered sleeping 0  Tired, decreased energy 0  Change in appetite 0  Feeling bad or failure about yourself  0  Trouble concentrating 1  Moving slowly or fidgety/restless 0  Suicidal thoughts 0  PHQ-9 Score 1   Difficult doing work/chores Not difficult at all   UTD with dentist and eye doctor  Other providers/specialists: Patient Care Team: Inda Coke, [place] as PCP - General (Physician Assistant)   PMHx, SurgHx, SocialHx, Medications, and Allergies were reviewed in the Visit Navigator and updated as appropriate.   Past Medical History:  Diagnosis Date  . OA (osteoarthritis) of knee, right      Past Surgical History:  Procedure Laterality Date  . ANTERIOR CRUCIATE LIGAMENT REPAIR Right      Family History  Problem Relation Age of Onset  . Bladder Cancer Father   . Colon cancer Neg Hx     Social History   Tobacco Use  . Smoking status: Never Smoker  . Smokeless tobacco: Never Used  Substance Use Topics  . Alcohol use: Yes    Alcohol/week: 7.0 - 13.0 standard drinks    Types: 5 - 10 Cans of beer, 2 - 3 Glasses of wine per week  . Drug use: No    Review of Systems:   Review of Systems  Constitutional: Negative for chills, fever, malaise/fatigue and weight loss.  HENT: Negative for hearing loss, sinus pain and sore throat.   Eyes: Negative for blurred vision.  Respiratory: Negative for cough and shortness of breath.   Cardiovascular: Negative for chest pain, palpitations and leg swelling.  Gastrointestinal: Negative for abdominal pain,  constipation, diarrhea, heartburn, nausea and vomiting.  Genitourinary: Negative for dysuria, frequency and urgency.  Musculoskeletal: Negative for back pain, myalgias and neck pain.  Skin: Negative for itching and rash.  Neurological: Negative for dizziness, tingling, seizures, loss of consciousness and headaches.  Endo/Heme/Allergies: Negative for polydipsia.  Psychiatric/Behavioral: Negative for depression. The patient is not nervous/anxious.   All other systems reviewed and are negative.   Objective:   Vitals:   10/10/20 0840  BP: 120/70  Pulse: 62  Temp: (!) 97.4 F (36.3 C)  SpO2: 97%   Body mass index is 25.94  kg/m.  General Appearance:  Alert, cooperative, no distress, appears stated age  Head:  Normocephalic, without obvious abnormality, atraumatic  Eyes:  PERRL, conjunctiva/corneas clear, EOM's intact, fundi benign, both eyes       Ears:  Normal TM's and external ear canals, both ears  Nose: Nares normal, septum midline, mucosa normal, no drainage    or sinus tenderness  Throat: Lips, mucosa, and tongue normal; teeth and gums normal  Neck: Supple, symmetrical, trachea midline, no adenopathy; thyroid:  No enlargement/tenderness/nodules; no carotit bruit or JVD  Back:   Symmetric, no curvature, ROM normal, no CVA tenderness  Lungs:   Clear to auscultation bilaterally, respirations unlabored  Chest wall:  No tenderness or deformity  Heart:  Regular rate and rhythm, S1 and S2 normal, no murmur, rub   or gallop  Abdomen:   Soft, non-tender, bowel sounds active all four quadrants, no masses, no organomegaly  Extremities: Extremities normal, atraumatic, no cyanosis or edema  Prostate: Not done.   Skin: Skin color, texture, turgor normal, no rashes or lesions  Lymph nodes: Cervical, supraclavicular, and axillary nodes normal  Neurologic: CNII-XII grossly intact. Normal strength, sensation and reflexes throughout    Assessment/Plan:   Cristian was seen today for annual exam.  Diagnoses and all orders for this visit:  Encounter for general adult medical examination with abnormal findings Today patient counseled on age appropriate routine health concerns for screening and prevention, each reviewed and up to date or declined. Immunizations reviewed and up to date or declined. Labs ordered and reviewed. Risk factors for depression reviewed and negative. Hearing function and visual acuity are intact. ADLs screened and addressed as needed. Functional ability and level of safety reviewed and appropriate. Education, counseling and referrals performed based on assessed risks today. Patient provided with a copy  of personalized plan for preventive services.  Nocturia Will update PSA today. If any concerns, will refer to urology. -     CBC with Differential/Platelet -     Comprehensive metabolic panel -     PSA  Encounter for screening for other viral diseases -     Hepatitis C antibody  Hyperlipidemia, unspecified hyperlipidemia type -     Lipid panel  Anxiety Well controlled. Consider stopping Wellbutrin 150 mg XL daily. Continue Zoloft 25 mg daily. Xanax prn -- very rarely used, no red flags. Follow-up as needed.  Special screening for malignant neoplasms, colon -     Ambulatory referral to Gastroenterology  Other orders -     ALPRAZolam (XANAX) 0.25 MG tablet; Take 1 tablet (0.25 mg total) by mouth 2 (two) times daily as needed for anxiety.   Well Adult Exam: Labs ordered: Yes. Patient counseling was done. See below for items discussed. Discussed the patient's BMI.  The BMI is in the acceptable range Follow up in one year.  Patient Counseling: [x]   Nutrition: Stressed importance of moderation in sodium/caffeine intake,  saturated fat and cholesterol, caloric balance, sufficient intake of fresh fruits, vegetables, and fiber.  [x]   Stressed the importance of regular exercise.   []   Substance Abuse: Discussed cessation/primary prevention of tobacco, alcohol, or other drug use; driving or other dangerous activities under the influence; availability of treatment for abuse.   [x]   Injury prevention: Discussed safety belts, safety helmets, smoke detector, smoking near bedding or upholstery.   []   Sexuality: Discussed sexually transmitted diseases, partner selection, use of condoms, avoidance of unintended pregnancy  and contraceptive alternatives.   [x]   Dental health: Discussed importance of regular tooth brushing, flossing, and dental visits.  [x]   Health maintenance and immunizations reviewed. Please refer to Health maintenance section.    CMA or LPN served as scribe during this visit.  History, Physical, and Plan performed by medical provider. The above documentation has been reviewed and is accurate and complete.  Inda Coke, PA-C [place]

## 2020-10-10 NOTE — Telephone Encounter (Signed)
Chart has been updated.

## 2020-10-24 MED FILL — SERTRALINE HCL 25 MG TABLET: 25 | 30 days supply | Qty: 30 | Fill #1

## 2020-10-24 MED FILL — buPROPion HCL ER (XL) 150 M: 150 | 30 days supply | Qty: 30 | Fill #1

## 2020-10-26 ENCOUNTER — Other Ambulatory Visit (HOSPITAL_BASED_OUTPATIENT_CLINIC_OR_DEPARTMENT_OTHER): Payer: Self-pay

## 2020-11-13 ENCOUNTER — Other Ambulatory Visit (HOSPITAL_COMMUNITY): Payer: Self-pay

## 2020-11-13 ENCOUNTER — Encounter: Payer: Self-pay | Admitting: Physician Assistant

## 2020-11-13 DIAGNOSIS — J301 Allergic rhinitis due to pollen: Secondary | ICD-10-CM

## 2020-11-13 MED ORDER — FLUTICASONE PROPIONATE 50 MCG/ACT NA SUSP
2.0000 | Freq: Every day | NASAL | 6 refills | Status: DC
  Filled 2020-11-13: qty 16, 30d supply, fill #0
  Filled 2020-12-25: qty 16, 30d supply, fill #1
  Filled 2021-04-16: qty 16, 30d supply, fill #2

## 2020-11-20 ENCOUNTER — Other Ambulatory Visit (HOSPITAL_COMMUNITY): Payer: Self-pay

## 2020-11-20 MED FILL — Bupropion HCl Tab ER 24HR 150 MG: ORAL | 30 days supply | Qty: 30 | Fill #0 | Status: AC

## 2020-11-20 MED FILL — Sertraline HCl Tab 25 MG: ORAL | 30 days supply | Qty: 30 | Fill #0 | Status: AC

## 2020-11-27 ENCOUNTER — Other Ambulatory Visit (HOSPITAL_COMMUNITY): Payer: Self-pay

## 2020-12-25 ENCOUNTER — Other Ambulatory Visit: Payer: Self-pay | Admitting: Physician Assistant

## 2020-12-25 ENCOUNTER — Other Ambulatory Visit (HOSPITAL_COMMUNITY): Payer: Self-pay

## 2020-12-25 DIAGNOSIS — F419 Anxiety disorder, unspecified: Secondary | ICD-10-CM

## 2020-12-25 MED ORDER — SERTRALINE HCL 25 MG PO TABS
ORAL_TABLET | Freq: Every day | ORAL | 2 refills | Status: DC
  Filled 2020-12-25: qty 30, 30d supply, fill #0
  Filled 2021-01-22: qty 30, 30d supply, fill #1

## 2020-12-25 MED ORDER — BUPROPION HCL ER (XL) 150 MG PO TB24
ORAL_TABLET | Freq: Every day | ORAL | 2 refills | Status: DC
  Filled 2020-12-25: qty 30, 30d supply, fill #0
  Filled 2021-01-22: qty 30, 30d supply, fill #1
  Filled 2021-03-03: qty 30, 30d supply, fill #2

## 2020-12-26 ENCOUNTER — Ambulatory Visit (AMBULATORY_SURGERY_CENTER): Payer: Self-pay | Admitting: *Deleted

## 2020-12-26 ENCOUNTER — Other Ambulatory Visit: Payer: Self-pay

## 2020-12-26 ENCOUNTER — Other Ambulatory Visit (HOSPITAL_COMMUNITY): Payer: Self-pay

## 2020-12-26 VITALS — Ht 75.0 in | Wt 214.0 lb

## 2020-12-26 DIAGNOSIS — Z1211 Encounter for screening for malignant neoplasm of colon: Secondary | ICD-10-CM

## 2020-12-26 MED ORDER — NA SULFATE-K SULFATE-MG SULF 17.5-3.13-1.6 GM/177ML PO SOLN
ORAL | 0 refills | Status: DC
  Filled 2020-12-26: qty 354, 1d supply, fill #0

## 2020-12-26 NOTE — Progress Notes (Signed)

## 2020-12-30 IMAGING — DX DG ANKLE COMPLETE 3+V*L*
3 series · 3 of 3 positions shown · non-contrast
Comparison: None

CLINICAL DATA: Left ankle pain and swelling anteriorly and
laterally for 1 month, mild swelling, no known injury

EXAM:
LEFT ANKLE COMPLETE - 3+ VIEW

[ankle ap]
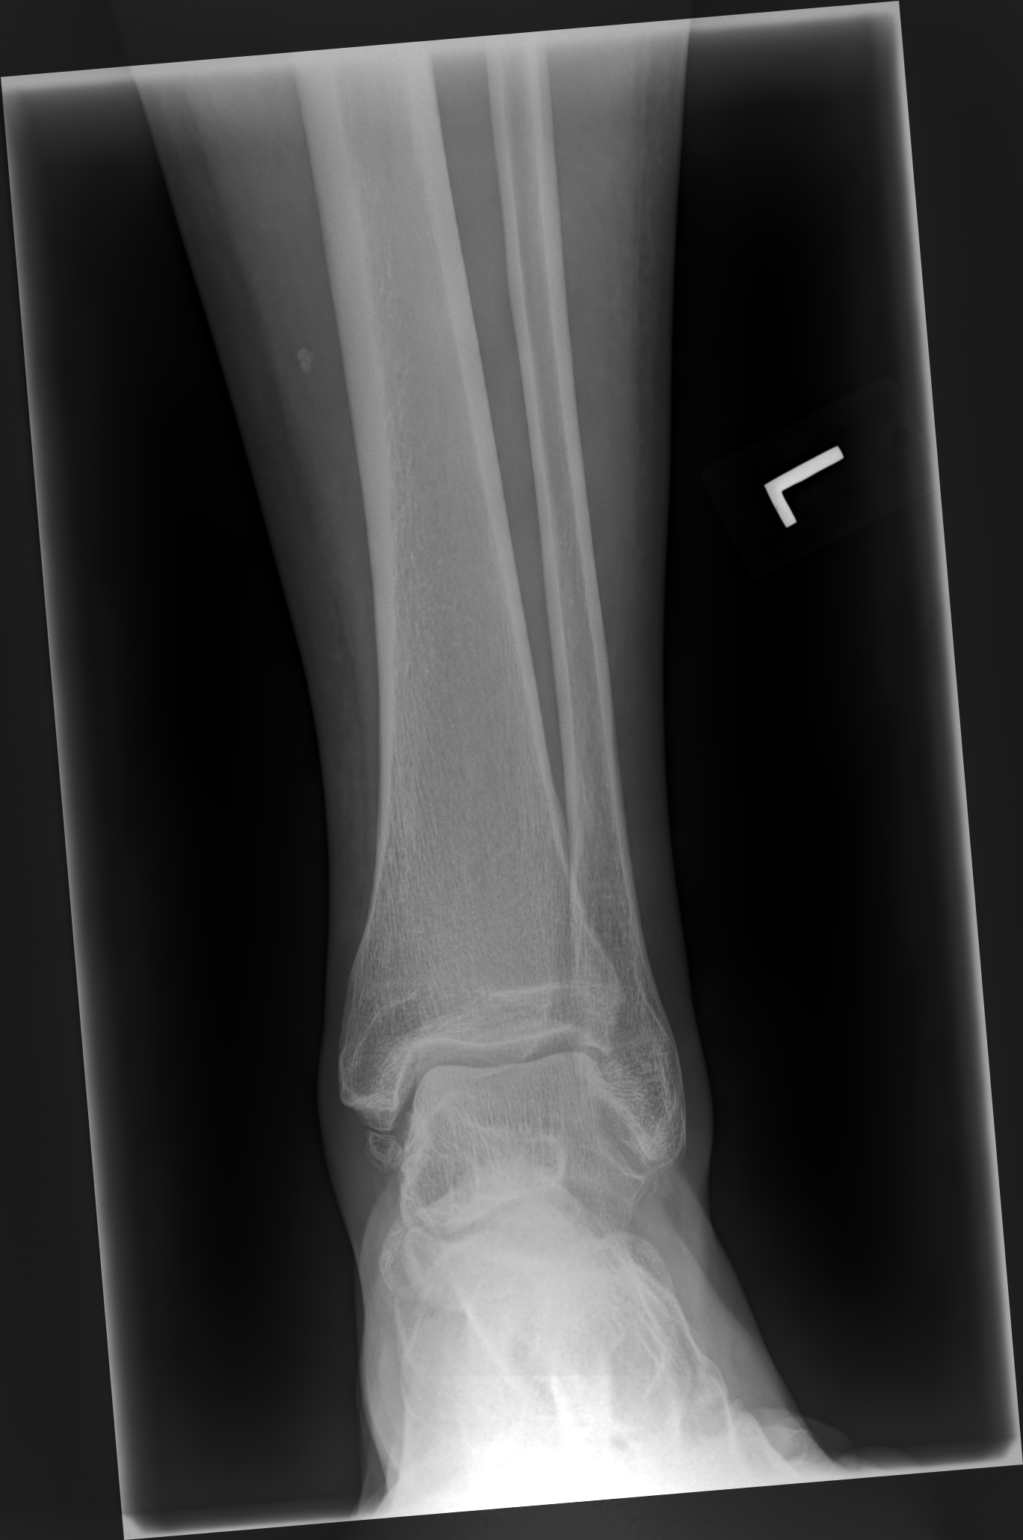

[ankle mlo]
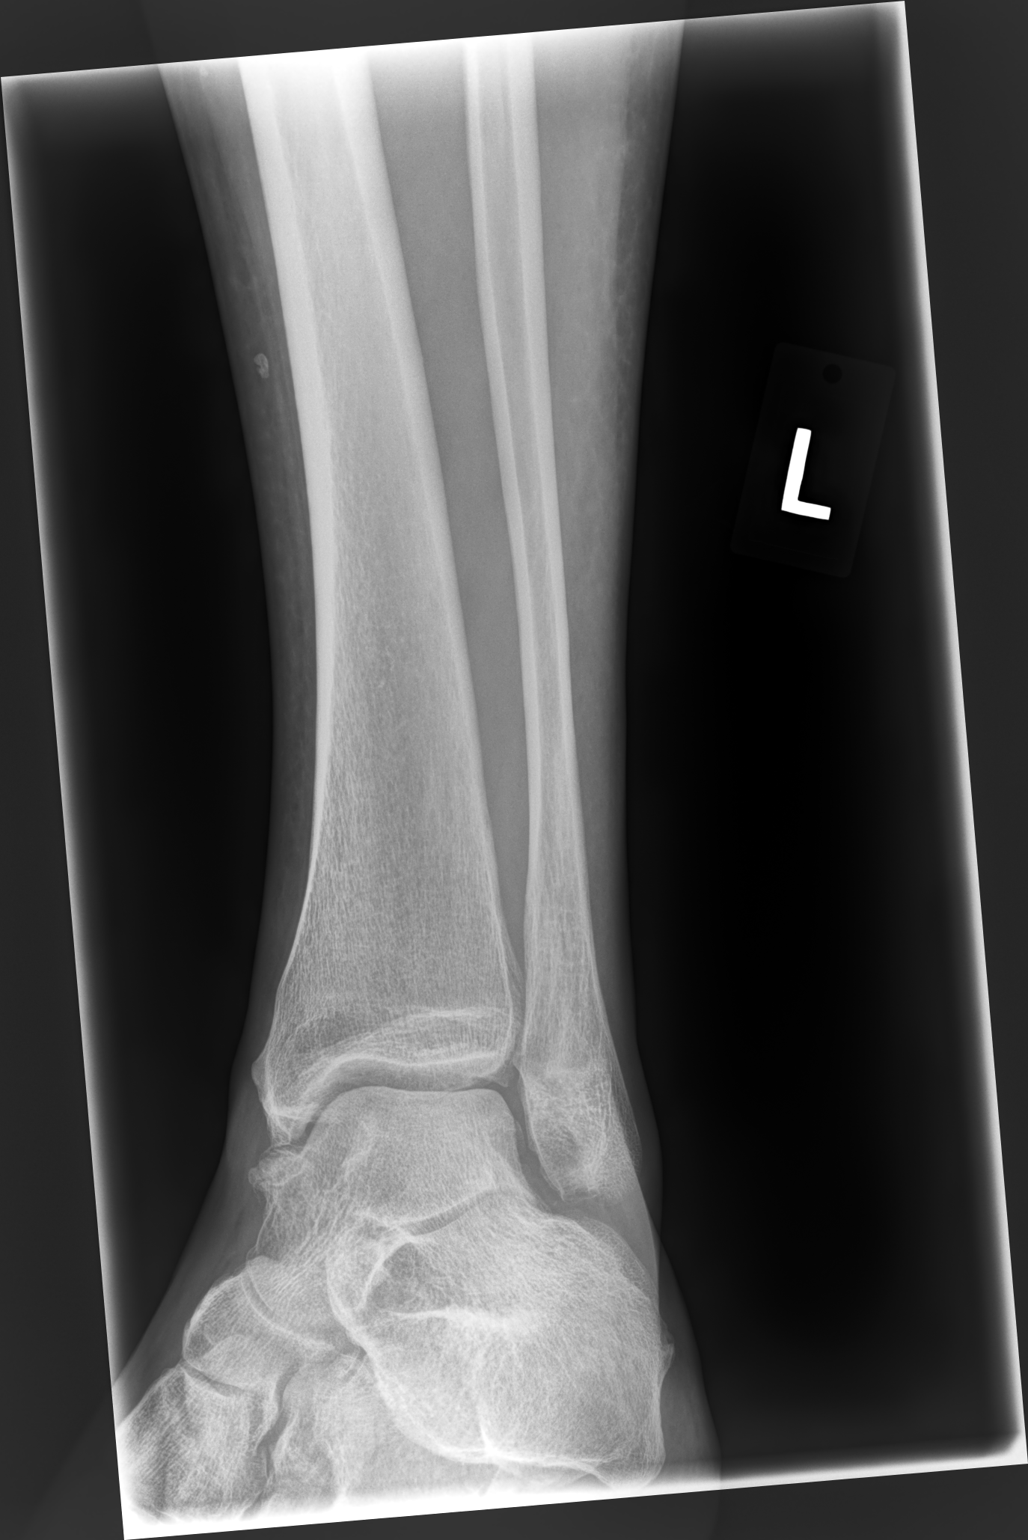

[ankle lat]
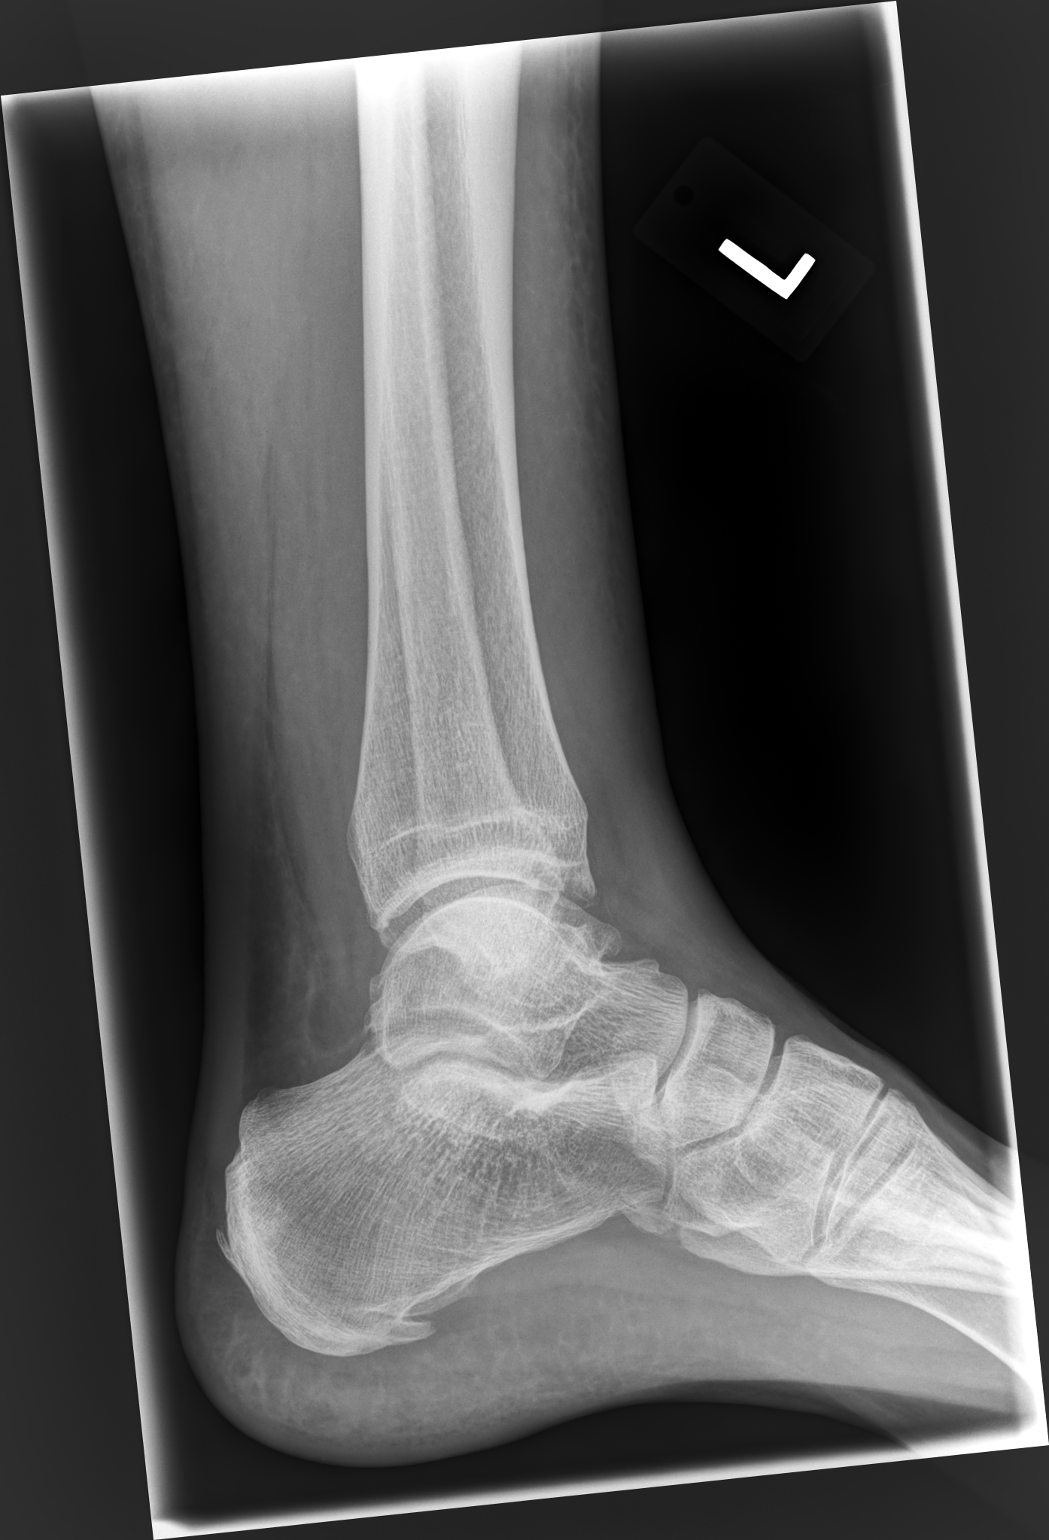

[3 of 3 positions shown; findings below may reference images not displayed]

FINDINGS: Osseous mineralization normal.

Non fused ossicle adjacent to tip of medial malleolus.

Joint spaces preserved.

No acute fracture, dislocation, or bone destruction.

Small plantar calcaneal spur.
IMPRESSION: No acute osseous abnormalities.

Small plantar calcaneal spur.

## 2021-01-09 ENCOUNTER — Ambulatory Visit (AMBULATORY_SURGERY_CENTER): Payer: No Typology Code available for payment source | Admitting: Gastroenterology

## 2021-01-09 ENCOUNTER — Encounter: Payer: Self-pay | Admitting: Gastroenterology

## 2021-01-09 ENCOUNTER — Other Ambulatory Visit: Payer: Self-pay

## 2021-01-09 VITALS — BP 117/72 | HR 52 | Temp 98.6°F | Resp 13 | Ht 75.0 in | Wt 214.0 lb

## 2021-01-09 DIAGNOSIS — D123 Benign neoplasm of transverse colon: Secondary | ICD-10-CM

## 2021-01-09 DIAGNOSIS — Z1211 Encounter for screening for malignant neoplasm of colon: Secondary | ICD-10-CM

## 2021-01-09 MED ORDER — SODIUM CHLORIDE 0.9 % IV SOLN: 500.0000 mL | Freq: Once | INTRAVENOUS | Status: DC

## 2021-01-09 NOTE — Progress Notes (Signed)
C. W. Vital signs. °

## 2021-01-09 NOTE — Progress Notes (Signed)
Pt's states no medical or surgical changes since previsit or office visit. 

## 2021-01-09 NOTE — Patient Instructions (Signed)
Discharge instructions given. Handouts on polyps,diverticulosis and hemorrhoids. Use Fiber Con 1-2 tablets po daily. Resume previous medications. YOU HAD AN ENDOSCOPIC PROCEDURE TODAY AT [facility]:   Refer to the procedure report that was given to you for any specific questions about what was found during the examination.  If the procedure report does not answer your questions, please call your gastroenterologist to clarify.  If you requested that your care partner not be given the details of your procedure findings, then the procedure report has been included in a sealed envelope for you to review at your convenience later.  YOU SHOULD EXPECT: Some feelings of bloating in the abdomen. Passage of more gas than usual.  Walking can help get rid of the air that was put into your GI tract during the procedure and reduce the bloating. If you had a lower endoscopy (such as a colonoscopy or flexible sigmoidoscopy) you may notice spotting of blood in your stool or on the toilet paper. If you underwent a bowel prep for your procedure, you may not have a normal bowel movement for a few days.  Please Note:  You might notice some irritation and congestion in your nose or some drainage.  This is from the oxygen used during your procedure.  There is no need for concern and it should clear up in a day or so.  SYMPTOMS TO REPORT IMMEDIATELY:   Following lower endoscopy (colonoscopy or flexible sigmoidoscopy):  Excessive amounts of blood in the stool  Significant tenderness or worsening of abdominal pains  Swelling of the abdomen that is new, acute  Fever of 100F or higher   For urgent or emergent issues, a gastroenterologist can be reached at any hour by calling [phone]. Do not use MyChart messaging for urgent concerns.    DIET:  We do recommend a small meal at first, but then you may proceed to your regular diet.  Drink plenty of fluids but you should avoid alcoholic beverages for  24 hours.  ACTIVITY:  You should plan to take it easy for the rest of today and you should NOT DRIVE or use heavy machinery until tomorrow (because of the sedation medicines used during the test).    FOLLOW UP: Our staff will call the number listed on your records 48-72 hours following your procedure to check on you and address any questions or concerns that you may have regarding the information given to you following your procedure. If we do not reach you, we will leave a message.  We will attempt to reach you two times.  During this call, we will ask if you have developed any symptoms of COVID 19. If you develop any symptoms (ie: fever, flu-like symptoms, shortness of breath, cough etc.) before then, please call [phone].  If you test positive for Covid 19 in the 2 weeks post procedure, please call and report this information to [place].    If any biopsies were taken you will be contacted by phone or by letter within the next 1-3 weeks.  Please call us at [phone] if you have not heard about the biopsies in 3 weeks.    SIGNATURES/CONFIDENTIALITY: You and/or your care partner have signed paperwork which will be entered into your electronic medical record.  These signatures attest to the fact that that the information above on your After Visit Summary has been reviewed and is understood.  Full responsibility of the confidentiality of this discharge information lies with you and/or your care-partner.

## 2021-01-09 NOTE — Progress Notes (Signed)
Called to room to assist during endoscopic procedure.  Patient ID and intended procedure confirmed with present staff. Received instructions for my participation in the procedure from the performing physician.  

## 2021-01-09 NOTE — Op Note (Signed)
[facility] Patient Name: Cristian Odom Procedure Date: 01/09/2021 11:32 AM MRN: [id number] Endoscopist: Justice Britain , MD Age: 45 Referring MD:  Date of Birth: [birth year] Gender: Male Account #: [id number] Procedure:                Colonoscopy Indications:              Screening for colorectal malignant neoplasm, This                            is the patient's first colonoscopy Medicines:                Monitored Anesthesia Care Procedure:                Pre-Anesthesia Assessment:                           - Prior to the procedure, a History and Physical                            was performed, and patient medications and                            allergies were reviewed. The patient's tolerance of                            previous anesthesia was also reviewed. The risks                            and benefits of the procedure and the sedation                            options and risks were discussed with the patient.                            All questions were answered, and informed consent                            was obtained. Prior Anticoagulants: The patient has                            taken no previous anticoagulant or antiplatelet                            agents. ASA Grade Assessment: I - A normal, healthy                            patient. After reviewing the risks and benefits,                            the patient was deemed in satisfactory condition to                            undergo the procedure.  After obtaining informed consent, the colonoscope                            was passed under direct vision. Throughout the                            procedure, the patient's blood pressure, pulse, and                            oxygen saturations were monitored continuously. The                            Olympus CF-HQ190 (#[id number]) Colonoscope was                            introduced through the anus and advanced to  the 5                            cm into the ileum. The colonoscopy was somewhat                            difficult due to restricted mobility of the left                            colon. Successful completion of the procedure was                            aided by changing the patient's position, using                            manual pressure, straightening and shortening the                            scope to obtain bowel loop reduction and using                            scope torsion. The patient tolerated the procedure.                            The quality of the bowel preparation was adequate.                            The terminal ileum, ileocecal valve, appendiceal                            orifice, and rectum were photographed. Scope In: 11:51:55 AM Scope Out: 12:12:07 PM Scope Withdrawal Time: 0 hours 14 minutes 41 seconds  Total Procedure Duration: 0 hours 20 minutes 12 seconds  Findings:                 The digital rectal exam findings include                            hemorrhoids. Pertinent negatives include no  palpable rectal lesions.                           The terminal ileum and ileocecal valve appeared                            normal.                           A 6 mm polyp was found in the transverse colon. The                            polyp was sessile. The polyp was removed with a                            cold snare. Resection and retrieval were complete.                           Multiple small-mouthed diverticula were found in                            the recto-sigmoid colon and sigmoid colon.                           Normal mucosa was found in the entire colon                            otherwise.                           Non-bleeding non-thrombosed internal hemorrhoids                            were found during retroflexion, during perianal                            exam and during digital exam. The hemorrhoids  were                            Grade II (internal hemorrhoids that prolapse but                            reduce spontaneously). Complications:            No immediate complications. Estimated Blood Loss:     Estimated blood loss was minimal. Impression:               - Hemorrhoids found on digital rectal exam.                           - The examined portion of the ileum was normal.                           - One 6 mm polyp in the transverse colon, removed  with a cold snare. Resected and retrieved.                           - Diverticulosis in the recto-sigmoid colon and in                            the sigmoid colon.                           - Normal mucosa in the entire examined colon                            otherwise.                           - Non-bleeding non-thrombosed internal hemorrhoids. Recommendation:           - The patient will be observed post-procedure,                            until all discharge criteria are met.                           - Discharge patient to home.                           - Patient has a contact number available for                            emergencies. The signs and symptoms of potential                            delayed complications were discussed with the                            patient. Return to normal activities tomorrow.                            Written discharge instructions were provided to the                            patient.                           - High fiber diet.                           - Use FiberCon 1-2 tablets PO daily.                           - Continue present medications.                           - Await pathology results.                           - Repeat colonoscopy in 5/7 years for surveillance  based on pathology results and adenomatous tissue.                           - The findings and recommendations were discussed                             with the patient.                           - The findings and recommendations were discussed                            with the patient's family. Justice Britain, MD 01/09/2021 12:17:19 PM

## 2021-01-09 NOTE — Progress Notes (Signed)
A/ox3, pleased with MAC, report to RN 

## 2021-01-11 ENCOUNTER — Telehealth: Payer: Self-pay

## 2021-01-11 ENCOUNTER — Telehealth: Payer: Self-pay | Admitting: *Deleted

## 2021-01-11 NOTE — Telephone Encounter (Signed)
First follow up call attempt.  LVM. 

## 2021-01-11 NOTE — Telephone Encounter (Signed)
  Follow up Call-  Call back number 01/09/2021  Post procedure Call Back phone  # [phone]  Permission to leave phone message Yes  Some recent data might be hidden     Patient questions:  Do you have a fever, pain , or abdominal swelling? No. Pain Score  0 *  Have you tolerated food without any problems? Yes.    Have you been able to return to your normal activities? Yes.    Do you have any questions about your discharge instructions: Diet   No. Medications  No. Follow up visit  No.  Do you have questions or concerns about your Care? No.  Actions: * If pain score is 4 or above: No action needed, pain <4.

## 2021-01-16 ENCOUNTER — Encounter: Payer: Self-pay | Admitting: Gastroenterology

## 2021-01-20 ENCOUNTER — Encounter: Payer: Self-pay | Admitting: Physician Assistant

## 2021-01-20 DIAGNOSIS — N528 Other male erectile dysfunction: Secondary | ICD-10-CM

## 2021-01-22 ENCOUNTER — Other Ambulatory Visit (HOSPITAL_COMMUNITY): Payer: Self-pay

## 2021-01-22 MED ORDER — TADALAFIL 20 MG PO TABS: ORAL_TABLET | ORAL | 5 refills | Status: DC

## 2021-03-03 ENCOUNTER — Other Ambulatory Visit (HOSPITAL_COMMUNITY): Payer: Self-pay

## 2021-03-08 ENCOUNTER — Other Ambulatory Visit (HOSPITAL_COMMUNITY): Payer: Self-pay

## 2021-03-31 ENCOUNTER — Other Ambulatory Visit: Payer: Self-pay | Admitting: Family Medicine

## 2021-03-31 DIAGNOSIS — F419 Anxiety disorder, unspecified: Secondary | ICD-10-CM

## 2021-04-02 ENCOUNTER — Other Ambulatory Visit (HOSPITAL_COMMUNITY): Payer: Self-pay

## 2021-04-02 MED ORDER — BUPROPION HCL ER (XL) 150 MG PO TB24
ORAL_TABLET | Freq: Every day | ORAL | 2 refills | Status: DC
  Filled 2021-04-02: qty 30, 30d supply, fill #0
  Filled 2021-04-30: qty 30, 30d supply, fill #1
  Filled 2021-05-27: qty 30, 30d supply, fill #2

## 2021-04-16 ENCOUNTER — Other Ambulatory Visit (HOSPITAL_COMMUNITY): Payer: Self-pay

## 2021-04-30 ENCOUNTER — Other Ambulatory Visit (HOSPITAL_COMMUNITY): Payer: Self-pay

## 2021-05-28 ENCOUNTER — Other Ambulatory Visit (HOSPITAL_COMMUNITY): Payer: Self-pay

## 2021-07-02 ENCOUNTER — Other Ambulatory Visit: Payer: Self-pay | Admitting: Physician Assistant

## 2021-07-02 ENCOUNTER — Other Ambulatory Visit (HOSPITAL_COMMUNITY): Payer: Self-pay

## 2021-07-02 DIAGNOSIS — F419 Anxiety disorder, unspecified: Secondary | ICD-10-CM

## 2021-07-02 MED ORDER — BUPROPION HCL ER (XL) 150 MG PO TB24
ORAL_TABLET | Freq: Every day | ORAL | 3 refills | Status: DC
  Filled 2021-07-02: qty 30, 30d supply, fill #0
  Filled 2021-08-01: qty 30, 30d supply, fill #1

## 2021-08-01 ENCOUNTER — Other Ambulatory Visit (HOSPITAL_COMMUNITY): Payer: Self-pay

## 2021-08-03 ENCOUNTER — Other Ambulatory Visit: Payer: Self-pay | Admitting: Physician Assistant

## 2021-08-07 ENCOUNTER — Other Ambulatory Visit (HOSPITAL_COMMUNITY): Payer: Self-pay

## 2021-08-07 MED ORDER — ONDANSETRON 4 MG PO TBDP
4.0000 mg | ORAL_TABLET | Freq: Three times a day (TID) | ORAL | 0 refills | Status: DC | PRN
  Filled 2021-08-07 (×2): qty 20, 7d supply, fill #0

## 2021-09-07 ENCOUNTER — Other Ambulatory Visit: Payer: Self-pay | Admitting: Family Medicine

## 2021-09-07 DIAGNOSIS — N528 Other male erectile dysfunction: Secondary | ICD-10-CM

## 2021-10-12 ENCOUNTER — Encounter: Payer: No Typology Code available for payment source | Admitting: Physician Assistant

## 2021-10-19 ENCOUNTER — Encounter: Payer: No Typology Code available for payment source | Admitting: Physician Assistant

## 2021-11-12 NOTE — Progress Notes (Signed)
? ?Subjective:  ?  ?Cristian Odom is a 46 y.o. male and is here for a comprehensive physical exam. ? ?HPI ? ?There are no preventive care reminders to display for this patient. ? ?Acute Concerns: ?Bilateral Leg Discomfort ?Cristian states he has been experiencing bilateral leg discomfort for several weeks. He describes feeling no pain but more of an uncomfortable sensation. States that this mainly occurs when he is in bed at night and gets settled. Pt has tried taking ibuprofen for this at times which provided minor and temporary relief. According to pt, he and his wife who is in the medical field, have looked up restless leg syndrome but don't believe this is the case.  ? ?Denies numbness/tingling, hx of blood clots. ? ?In addition to his bilateral leg discomfort, pt has noticed slight swelling in his right ankle. While he is used to experiencing this in both ankles following a high intake of salt or prolonged walking, it has been occurring more frequently in just his right ankle. He does wear a knee brace occasionally and isn't sure if this could contributing. At this time he is not sure what should be done next if anything.  Denies redness, recent injury, hx of trauma/surgery, or hx of blood clots.  ? ?Chronic Issues: ?Anxiety  ?Cristian is currently non-compliant with taking zoloft 25 mg daily, wellbutrin XL 150 mg daily, and xanax 0.5 mg as needed. States he stopped use of the medications a couple of months ago due to feeling he didn't need them anymore. At this time he is experiencing situational stress due to work and tax time, but he is managing well.  ? ?Erectile Dysfunction ?Cristian is currently compliant with taking cialis 20 mg daily as needed with no complications. He finds this medication beneficial and is managing well.  ? ?Health Maintenance: ?Immunizations ?Influenza- Due;2021 ?Tdap- UTD;2021 ?Colonoscopy -- UTD;2022 repeat 5-7 years ?PSA --  ?Lab Results  ?Component Value Date  ? PSA 0.83 10/10/2020   ?Dentistry- UTD ?Ophthalmology- UTD ?Diet -- Eats all food groups ?Sleep habits -- No concerns ?Exercise -- Walks every other day, visits the gym 2 days x week ?Weight -- Stable ?Weight history ?Wt Readings from Last 10 Encounters:  ?11/13/21 209 lb (94.8 kg)  ?01/09/21 214 lb (97.1 kg)  ?12/26/20 214 lb (97.1 kg)  ?10/10/20 207 lb 8 oz (94.1 kg)  ?12/14/19 219 lb 3.2 oz (99.4 kg)  ?12/03/19 215 lb 6.1 oz (97.7 kg)  ?09/08/19 216 lb (98 kg)  ?05/27/19 210 lb (95.3 kg)  ?10/20/18 210 lb 4 oz (95.4 kg)  ?07/21/18 207 lb (93.9 kg)  ? ?Body mass index is 26.12 kg/m?. ?Mood -- No concerns ?Tobacco use --  ?Tobacco Use: Low Risk   ? Smoking Tobacco Use: Never  ? Smokeless Tobacco Use: Never  ? Passive Exposure: Not on file  ?  ?Alcohol use ---  reports current alcohol use of about 5.0 - 7.0 standard drinks per week.  ? ? ?  11/13/2021  ?  8:11 AM  ?Depression screen PHQ 2/9  ?Decreased Interest 0  ?Down, Depressed, Hopeless 0  ?PHQ - 2 Score 0  ? ? ? ?Other providers/specialists: ?Patient Care Team: ?Inda Coke, PA as PCP - General (Physician Assistant) ? ? ?PMHx, SurgHx, SocialHx, Medications, and Allergies were reviewed in the Visit Navigator and updated as appropriate.  ? ?Past Medical History:  ?Diagnosis Date  ? Allergy   ? OA (osteoarthritis) of knee, right   ? ? ? ?Past Surgical  History:  ?Procedure Laterality Date  ? ANTERIOR CRUCIATE LIGAMENT REPAIR Right   ? ? ? ?Family History  ?Problem Relation Age of Onset  ? Bladder Cancer Father   ? Colon polyps Father   ? Colon cancer Neg Hx   ? Esophageal cancer Neg Hx   ? Rectal cancer Neg Hx   ? Stomach cancer Neg Hx   ? ? ?Social History  ? ?Tobacco Use  ? Smoking status: Never  ? Smokeless tobacco: Never  ?Vaping Use  ? Vaping Use: Never used  ?Substance Use Topics  ? Alcohol use: Yes  ?  Alcohol/week: 5.0 - 7.0 standard drinks  ?  Types: 5 - 7 Standard drinks or equivalent per week  ? Drug use: No  ? ? ?Review of Systems:  ? ?Review of Systems  ?Constitutional:   Negative for chills, fever, malaise/fatigue and weight loss.  ?HENT:  Negative for hearing loss, sinus pain and sore throat.   ?Respiratory:  Negative for cough and hemoptysis.   ?Cardiovascular:  Negative for chest pain, palpitations, leg swelling and PND.  ?Gastrointestinal:  Negative for abdominal pain, constipation, diarrhea, heartburn, nausea and vomiting.  ?Genitourinary:  Negative for dysuria, frequency and urgency.  ?Musculoskeletal:  Negative for back pain, myalgias and neck pain.  ?Skin:  Negative for itching and rash.  ?Neurological:  Negative for dizziness, tingling, seizures and headaches.  ?Endo/Heme/Allergies:  Negative for polydipsia.  ?Psychiatric/Behavioral:  Negative for depression. The patient is not nervous/anxious.   ? ? ? ?Objective:  ? ?Vitals:  ? 11/13/21 0809  ?BP: 110/64  ?Pulse: (!) 58  ?Temp: (!) 97.2 ?F (36.2 ?C)  ?SpO2: 98%  ? ?Body mass index is 26.12 kg/m?. ? ?General Appearance:  Alert, cooperative, no distress, appears stated age  ?Head:  Normocephalic, without obvious abnormality, atraumatic  ?Eyes:  PERRL, conjunctiva/corneas clear, EOM's intact, fundi benign, both eyes       ?Ears:  Normal TM's and external ear canals, both ears  ?Nose: Nares normal, septum midline, mucosa normal, no drainage    or sinus tenderness  ?Throat: Lips, mucosa, and tongue normal; teeth and gums normal  ?Neck: Supple, symmetrical, trachea midline, no adenopathy; thyroid:  No enlargement/tenderness/nodules; no carotit bruit or JVD  ?Back:   Symmetric, no curvature, ROM normal, no CVA tenderness  ?Lungs:   Clear to auscultation bilaterally, respirations unlabored  ?Chest wall:  No tenderness or deformity  ?Heart:  Regular rate and rhythm, S1 and S2 normal, no murmur, rub   or gallop  ?Abdomen:   Soft, non-tender, bowel sounds active all four quadrants, no masses, no organomegaly  ?Extremities: Extremities normal, atraumatic, no cyanosis or edema  ?Prostate: Not done.   ?Skin: Skin color, texture, turgor  normal, no rashes or lesions  ?Lymph nodes: Cervical, supraclavicular, and axillary nodes normal  ?Neurologic: CNII-XII grossly intact. Normal strength, sensation and reflexes throughout  ? ? ?Assessment/Plan:  ? ?Encounter for general adult medical examination with abnormal findings ?Today patient counseled on age appropriate routine health concerns for screening and prevention, each reviewed and up to date or declined. Immunizations reviewed and up to date or declined. Labs ordered and reviewed. Risk factors for depression reviewed and negative. Hearing function and visual acuity are intact. ADLs screened and addressed as needed. Functional ability and level of safety reviewed and appropriate. Education, counseling and referrals performed based on assessed risks today. Patient provided with a copy of personalized plan for preventive services. ? ?*Did offer calcium score due to family history  of heart disease ? ?Bilateral leg pain ?Update labs, will make recommendations accordingly  ?Unclear etiology, discussed as follows: ?-I will check a Creatinine Kinase level to make sure this is not related to some sort of muscle breakdown ?-We could get an ultrasound of both legs to evaluate blood flow (ABI's) ?-We could send you to sports medicine to further evaluate ? ?Other male erectile dysfunction ?No red flags ?Continue cialis 20 mg daily as needed ?Follow up as needed ? ?Anxiety ?Controlled ?No red flags;Denies SI/HI ?Declines further medication intervention at this time ?I advised patient that if they develop any SI, to tell someone immediately and seek medical attention ?Follow up as needed ? ? ? ? ? ?Patient Counseling: ?'[x]'$   Nutrition: Stressed importance of moderation in sodium/caffeine intake, saturated fat and cholesterol, caloric balance, sufficient intake of fresh fruits, vegetables, and fiber.  ?'[x]'$   Stressed the importance of regular exercise.   ?'[]'$   Substance Abuse: Discussed cessation/primary prevention of  tobacco, alcohol, or other drug use; driving or other dangerous activities under the influence; availability of treatment for abuse.   ?'[x]'$   Injury prevention: Discussed safety belts, safety helmets, smoke d

## 2021-11-13 ENCOUNTER — Ambulatory Visit (INDEPENDENT_AMBULATORY_CARE_PROVIDER_SITE_OTHER): Payer: 59 | Admitting: Physician Assistant

## 2021-11-13 ENCOUNTER — Encounter: Payer: Self-pay | Admitting: Physician Assistant

## 2021-11-13 VITALS — BP 110/64 | HR 58 | Temp 97.2°F | Ht 75.0 in | Wt 209.0 lb

## 2021-11-13 DIAGNOSIS — M79604 Pain in right leg: Secondary | ICD-10-CM

## 2021-11-13 DIAGNOSIS — Z0001 Encounter for general adult medical examination with abnormal findings: Secondary | ICD-10-CM

## 2021-11-13 DIAGNOSIS — M79605 Pain in left leg: Secondary | ICD-10-CM | POA: Diagnosis not present

## 2021-11-13 DIAGNOSIS — N528 Other male erectile dysfunction: Secondary | ICD-10-CM | POA: Diagnosis not present

## 2021-11-13 DIAGNOSIS — F419 Anxiety disorder, unspecified: Secondary | ICD-10-CM

## 2021-11-13 LAB — CBC WITH DIFFERENTIAL/PLATELET
Basophils Absolute: 0 10*3/uL (ref 0.0–0.1)
Basophils Relative: 0.6 % (ref 0.0–3.0)
Eosinophils Absolute: 0.4 10*3/uL (ref 0.0–0.7)
Eosinophils Relative: 9.2 % — ABNORMAL HIGH (ref 0.0–5.0)
HCT: 41.9 % (ref 39.0–52.0)
Hemoglobin: 13.9 g/dL (ref 13.0–17.0)
Lymphocytes Relative: 29.6 % (ref 12.0–46.0)
Lymphs Abs: 1.2 10*3/uL (ref 0.7–4.0)
MCHC: 33.2 g/dL (ref 30.0–36.0)
MCV: 87 fl (ref 78.0–100.0)
Monocytes Absolute: 0.4 10*3/uL (ref 0.1–1.0)
Monocytes Relative: 10.1 % (ref 3.0–12.0)
Neutro Abs: 2 10*3/uL (ref 1.4–7.7)
Neutrophils Relative %: 50.5 % (ref 43.0–77.0)
Platelets: 181 10*3/uL (ref 150.0–400.0)
RBC: 4.82 Mil/uL (ref 4.22–5.81)
RDW: 14.4 % (ref 11.5–15.5)
WBC: 4 10*3/uL (ref 4.0–10.5)

## 2021-11-13 LAB — COMPREHENSIVE METABOLIC PANEL
ALT: 26 U/L (ref 0–53)
AST: 31 U/L (ref 0–37)
Albumin: 4.3 g/dL (ref 3.5–5.2)
Alkaline Phosphatase: 53 U/L (ref 39–117)
BUN: 18 mg/dL (ref 6–23)
CO2: 29 mEq/L (ref 19–32)
Calcium: 9.3 mg/dL (ref 8.4–10.5)
Chloride: 105 mEq/L (ref 96–112)
Creatinine, Ser: 1.06 mg/dL (ref 0.40–1.50)
GFR: 83.27 mL/min (ref >60.00)
Glucose, Bld: 92 mg/dL (ref 70–99)
Potassium: 4.7 mEq/L (ref 3.5–5.1)
Sodium: 140 mEq/L (ref 135–145)
Total Bilirubin: 0.5 mg/dL (ref 0.2–1.2)
Total Protein: 6.6 g/dL (ref 6.0–8.3)

## 2021-11-13 LAB — LIPID PANEL
Cholesterol: 171 mg/dL (ref 0–200)
HDL: 46.9 mg/dL (ref >39.00)
LDL Cholesterol: 113 mg/dL — ABNORMAL HIGH (ref 0–99)
NonHDL: 124.56
Total CHOL/HDL Ratio: 4
Triglycerides: 59 mg/dL (ref 0.0–149.0)
VLDL: 11.8 mg/dL (ref 0.0–40.0)

## 2021-11-13 LAB — CK: Total CK: 136 U/L (ref 7–232)

## 2021-11-13 NOTE — Patient Instructions (Addendum)
It was great to see you! ? ?For your legs: ?-I will check a Creatinine Kinase level to make sure this is not related to some sort of muscle breakdown ?-We could get an ultrasound of both legs to evaluate blood flow (ABI's) ?-We could send you to sports medicine to further evaluate ? ?For your family history of heart disease: ?-We are now doing calcium score of the heart to evaluate plaque burden of patient's hearts. This is a quick CT scan done at one of our cardiology offices.  ?-Currently this is $99 out of pocket ?-Let me know if you'd like to do this ? ?Please go to the lab for blood work.  ? ?Our office will call you with your results unless you have chosen to receive results via MyChart. ? ?If your blood work is normal we will follow-up each year for physicals and as scheduled for chronic medical problems. ? ?If anything is abnormal we will treat accordingly and get you in for a follow-up. ? ?Take care, ? ?Aldona Bar ?  ?

## 2021-12-18 ENCOUNTER — Other Ambulatory Visit: Payer: Self-pay | Admitting: Physician Assistant

## 2021-12-18 ENCOUNTER — Other Ambulatory Visit (HOSPITAL_COMMUNITY): Payer: Self-pay

## 2021-12-18 DIAGNOSIS — J301 Allergic rhinitis due to pollen: Secondary | ICD-10-CM

## 2021-12-18 MED ORDER — FLUTICASONE PROPIONATE 50 MCG/ACT NA SUSP
2.0000 | Freq: Every day | NASAL | 6 refills | Status: DC
  Filled 2021-12-18: qty 16, 30d supply, fill #0
  Filled 2022-02-07: qty 16, 30d supply, fill #1

## 2022-02-07 ENCOUNTER — Other Ambulatory Visit (HOSPITAL_COMMUNITY): Payer: Self-pay

## 2022-03-25 DIAGNOSIS — H5213 Myopia, bilateral: Secondary | ICD-10-CM | POA: Diagnosis not present

## 2022-04-29 ENCOUNTER — Encounter: Payer: Self-pay | Admitting: *Deleted

## 2022-05-29 ENCOUNTER — Other Ambulatory Visit: Payer: Self-pay | Admitting: *Deleted

## 2022-05-29 DIAGNOSIS — N528 Other male erectile dysfunction: Secondary | ICD-10-CM

## 2022-05-29 MED ORDER — TADALAFIL 20 MG PO TABS: ORAL_TABLET | ORAL | 5 refills | Status: DC

## 2022-07-02 ENCOUNTER — Other Ambulatory Visit: Payer: Self-pay | Admitting: Physician Assistant

## 2022-07-03 ENCOUNTER — Other Ambulatory Visit (HOSPITAL_COMMUNITY): Payer: Self-pay

## 2022-07-03 MED ORDER — ALPRAZOLAM 0.25 MG PO TABS
0.2500 mg | ORAL_TABLET | Freq: Two times a day (BID) | ORAL | 0 refills | Status: DC | PRN
  Filled 2022-07-03: qty 20, 10d supply, fill #0

## 2022-07-03 NOTE — Telephone Encounter (Signed)
Pt requesting refill for Alprazolam.0.25 mg. Last OV 11/13/2021, scheduled 11/19/2022.

## 2022-07-04 ENCOUNTER — Other Ambulatory Visit (HOSPITAL_COMMUNITY): Payer: Self-pay

## 2022-07-18 ENCOUNTER — Encounter: Payer: Self-pay | Admitting: *Deleted

## 2022-07-26 ENCOUNTER — Ambulatory Visit: Payer: 59 | Admitting: Physician Assistant

## 2022-07-26 ENCOUNTER — Other Ambulatory Visit (HOSPITAL_COMMUNITY): Payer: Self-pay

## 2022-07-26 ENCOUNTER — Encounter: Payer: Self-pay | Admitting: Physician Assistant

## 2022-07-26 VITALS — BP 118/76 | HR 58 | Temp 97.5°F | Resp 16 | Ht 77.0 in | Wt 209.8 lb

## 2022-07-26 DIAGNOSIS — F419 Anxiety disorder, unspecified: Secondary | ICD-10-CM

## 2022-07-26 DIAGNOSIS — F32A Depression, unspecified: Secondary | ICD-10-CM | POA: Diagnosis not present

## 2022-07-26 MED ORDER — BUPROPION HCL ER (XL) 150 MG PO TB24
150.0000 mg | ORAL_TABLET | Freq: Every day | ORAL | 1 refills | Status: DC
  Filled 2022-07-26: qty 30, 30d supply, fill #0
  Filled 2022-08-20 – 2022-08-28 (×3): qty 30, 30d supply, fill #1

## 2022-07-26 NOTE — Patient Instructions (Signed)
It was great to see you!  Start Wellbutrin 150 mg XL daily After 4 weeks, you can increase your Wellbutrin 300 mg XL daily if needed Message me if you would like to start therapy -- I'll place referral  We could consider Trintellix in the future if you do not like certain side effects from the wellbutrin  If you develop suicidal thoughts, please tell someone and immediately proceed to our local 24/7 crisis center, [facility] at the [facility] [address], [place], [place] [address] [phone].  Take care,  Inda Coke PA-C

## 2022-07-26 NOTE — Progress Notes (Signed)
Cristian Odom is a 46 y.o. male here for a follow up of a pre-existing problem.  History of Present Illness:   Chief Complaint  Patient presents with   Anxiety    Struggling over the past 2-3 weeks Denies any panic attacks    Anxiety Patient is complaining of feeling more anxious over the last 2-3 weeks. He states that he has always had moderate anxiety but as of lately it has been more intense. He reports that he is dealing with different life stress like things with his son.  Patient explains that he has tried zoloft, prozac, and wellbutrin in the past. He expresses that he had issues with his dosage because he didn't feel like they were effective. He confirms that he has also tried lexapro years ago, but didn't like the weight gain side effect. He reports that he is willing to go back on medication and would like to start with wellbutrin alone. He is willing to add an additional medication in the future if it is needed. Patient never uses his Xanax and claims that he might use it 3 times a year.He is not currently seeing a counselor, but exclaims it was somewhat helpful in the past. He denies suicide ideation, changes in lifestyle, and increase in alcohol.   Past Medical History:  Diagnosis Date   Allergy    OA (osteoarthritis) of knee, right      Social History   Tobacco Use   Smoking status: Never   Smokeless tobacco: Never  Vaping Use   Vaping Use: Never used  Substance Use Topics   Alcohol use: Yes    Alcohol/week: 5.0 - 7.0 standard drinks of alcohol    Types: 5 - 7 Standard drinks or equivalent per week   Drug use: No    Past Surgical History:  Procedure Laterality Date   ANTERIOR CRUCIATE LIGAMENT REPAIR Right     Family History  Problem Relation Age of Onset   Bladder Cancer Father    Colon polyps Father    Heart attack Father 62   Colon cancer Neg Hx    Esophageal cancer Neg Hx    Rectal cancer Neg Hx    Stomach cancer Neg Hx     No Known  Allergies  Current Medications:   Current Outpatient Medications:    ALPRAZolam (XANAX) 0.25 MG tablet, Take 1 tablet (0.25 mg total) by mouth 2 (two) times daily as needed for anxiety., Disp: 20 tablet, Rfl: 0   buPROPion (WELLBUTRIN XL) 150 MG 24 hr tablet, Take 1 tablet (150 mg total) by mouth daily., Disp: 30 tablet, Rfl: 1   Cetirizine HCl 10 MG CAPS, Take 10 mg by mouth daily. , Disp: , Rfl:    fluticasone (FLONASE) 50 MCG/ACT nasal spray, Place 2 sprays into both nostrils daily., Disp: 16 g, Rfl: 6   MAGNESIUM CITRATE PO, Take 1 capsule by mouth daily., Disp: , Rfl:    Multiple Vitamins-Minerals (CENTRUM ADULTS) TABS, Take 1 tablet by mouth daily in the afternoon., Disp: , Rfl:    Omega-3 Fatty Acids (FISH OIL PO), Take 2 capsules by mouth daily., Disp: , Rfl:    ondansetron (ZOFRAN-ODT) 4 MG disintegrating tablet, Dissolve 1 tablet (4 mg total) by mouth every 8 (eight) hours as needed for nausea or vomiting., Disp: 20 tablet, Rfl: 0   tadalafil (CIALIS) 20 MG tablet, TAKE 1/2 TO 1 TABLET BY MOUTH EVERY OTHER DAY AS NEEDED FOR ERECTILE DYSFUNCTION, Disp: 20 tablet, Rfl: 5  Review of Systems:   Review of Systems  Psychiatric/Behavioral:  The patient is nervous/anxious.     Vitals:   Vitals:   07/26/22 0805  BP: 118/76  Pulse: (!) 58  Resp: 16  Temp: (!) 97.5 F (36.4 C)  TempSrc: Temporal  SpO2: 97%  Weight: 209 lb 12.8 oz (95.2 kg)  Height: '6\' 5"'$  (1.956 m)     Body mass index is 24.88 kg/m.  Physical Exam:   Physical Exam Vitals and nursing note reviewed.  Constitutional:      Appearance: He is well-developed.  HENT:     Head: Normocephalic.  Eyes:     Conjunctiva/sclera: Conjunctivae normal.     Pupils: Pupils are equal, round, and reactive to light.  Pulmonary:     Effort: Pulmonary effort is normal.  Musculoskeletal:        General: Normal range of motion.     Cervical back: Normal range of motion.  Skin:    General: Skin is warm and dry.   Neurological:     Mental Status: He is alert and oriented to person, place, and time.  Psychiatric:        Behavior: Behavior normal.        Thought Content: Thought content normal.        Judgment: Judgment normal.     Assessment and Plan:   Anxiety and depression Uncontrolled No red flags/SI/HI Start wellbutrin 150 mg daily, may increase to 300 mg daily after 4 weeks Recommend talk therapy -- he will let us know if he would like referral I discussed with patient that if they develop any SI, to tell someone immediately and seek medical attention. Recommend follow-up in 2-3 months to reassess mood Consider trintellix if sexual side effects  I,Verona Buck,acting as a Education administrator for [employer], PA.,have documented all relevant documentation on the behalf of Inda Coke, PA,as directed by  Inda Coke, PA while in the presence of Inda Coke, [place].  I, Inda Coke, [place], have reviewed all documentation for this visit. The documentation on 07/26/22 for the exam, diagnosis, procedures, and orders are all accurate and complete.  Inda Coke, PA-C

## 2022-08-01 DIAGNOSIS — N529 Male erectile dysfunction, unspecified: Secondary | ICD-10-CM | POA: Diagnosis not present

## 2022-08-20 ENCOUNTER — Other Ambulatory Visit (HOSPITAL_COMMUNITY): Payer: Self-pay

## 2022-08-26 ENCOUNTER — Other Ambulatory Visit (HOSPITAL_COMMUNITY): Payer: Self-pay

## 2022-08-28 ENCOUNTER — Other Ambulatory Visit (HOSPITAL_COMMUNITY): Payer: Self-pay

## 2022-09-23 ENCOUNTER — Other Ambulatory Visit (HOSPITAL_COMMUNITY): Payer: Self-pay

## 2022-09-23 ENCOUNTER — Other Ambulatory Visit: Payer: Self-pay | Admitting: Physician Assistant

## 2022-09-23 ENCOUNTER — Other Ambulatory Visit: Payer: Self-pay

## 2022-09-23 MED ORDER — BUPROPION HCL ER (XL) 150 MG PO TB24
150.0000 mg | ORAL_TABLET | Freq: Every day | ORAL | 2 refills | Status: DC
  Filled 2022-09-23: qty 30, 30d supply, fill #0
  Filled 2022-10-18: qty 30, 30d supply, fill #1
  Filled 2022-11-21: qty 30, 30d supply, fill #2

## 2022-09-29 ENCOUNTER — Other Ambulatory Visit: Payer: Self-pay | Admitting: Physician Assistant

## 2022-09-30 MED ORDER — ONDANSETRON 4 MG PO TBDP
4.0000 mg | ORAL_TABLET | Freq: Three times a day (TID) | ORAL | 0 refills | Status: DC | PRN
  Filled 2022-09-30: qty 20, 7d supply, fill #0

## 2022-10-01 ENCOUNTER — Other Ambulatory Visit: Payer: Self-pay

## 2022-10-18 ENCOUNTER — Other Ambulatory Visit (HOSPITAL_COMMUNITY): Payer: Self-pay

## 2022-11-18 ENCOUNTER — Encounter: Payer: 59 | Admitting: Physician Assistant

## 2022-11-19 ENCOUNTER — Encounter: Payer: Self-pay | Admitting: Physician Assistant

## 2022-11-19 ENCOUNTER — Other Ambulatory Visit (HOSPITAL_COMMUNITY): Payer: Self-pay

## 2022-11-19 ENCOUNTER — Other Ambulatory Visit: Payer: Self-pay

## 2022-11-19 ENCOUNTER — Ambulatory Visit (INDEPENDENT_AMBULATORY_CARE_PROVIDER_SITE_OTHER): Payer: Commercial Managed Care - PPO | Admitting: Physician Assistant

## 2022-11-19 VITALS — BP 118/70 | HR 60 | Temp 97.5°F | Ht 77.0 in | Wt 207.2 lb

## 2022-11-19 DIAGNOSIS — B078 Other viral warts: Secondary | ICD-10-CM

## 2022-11-19 DIAGNOSIS — F419 Anxiety disorder, unspecified: Secondary | ICD-10-CM | POA: Diagnosis not present

## 2022-11-19 DIAGNOSIS — Z125 Encounter for screening for malignant neoplasm of prostate: Secondary | ICD-10-CM

## 2022-11-19 DIAGNOSIS — F32A Depression, unspecified: Secondary | ICD-10-CM | POA: Diagnosis not present

## 2022-11-19 DIAGNOSIS — B351 Tinea unguium: Secondary | ICD-10-CM

## 2022-11-19 DIAGNOSIS — Z0001 Encounter for general adult medical examination with abnormal findings: Secondary | ICD-10-CM | POA: Diagnosis not present

## 2022-11-19 LAB — COMPREHENSIVE METABOLIC PANEL
ALT: 19 U/L (ref 0–53)
AST: 24 U/L (ref 0–37)
Albumin: 4.2 g/dL (ref 3.5–5.2)
Alkaline Phosphatase: 53 U/L (ref 39–117)
BUN: 16 mg/dL (ref 6–23)
CO2: 29 mEq/L (ref 19–32)
Calcium: 9.2 mg/dL (ref 8.4–10.5)
Chloride: 104 mEq/L (ref 96–112)
Creatinine, Ser: 1.18 mg/dL (ref 0.40–1.50)
GFR: 72.69 mL/min (ref >60.00)
Glucose, Bld: 88 mg/dL (ref 70–99)
Potassium: 4.2 mEq/L (ref 3.5–5.1)
Sodium: 140 mEq/L (ref 135–145)
Total Bilirubin: 0.5 mg/dL (ref 0.2–1.2)
Total Protein: 6.7 g/dL (ref 6.0–8.3)

## 2022-11-19 LAB — CBC WITH DIFFERENTIAL/PLATELET
Basophils Absolute: 0 10*3/uL (ref 0.0–0.1)
Basophils Relative: 0.4 % (ref 0.0–3.0)
Eosinophils Absolute: 0.3 10*3/uL (ref 0.0–0.7)
Eosinophils Relative: 6 % — ABNORMAL HIGH (ref 0.0–5.0)
HCT: 42.5 % (ref 39.0–52.0)
Hemoglobin: 14.4 g/dL (ref 13.0–17.0)
Lymphocytes Relative: 30.6 % (ref 12.0–46.0)
Lymphs Abs: 1.3 10*3/uL (ref 0.7–4.0)
MCHC: 33.9 g/dL (ref 30.0–36.0)
MCV: 88 fl (ref 78.0–100.0)
Monocytes Absolute: 0.4 10*3/uL (ref 0.1–1.0)
Monocytes Relative: 8.9 % (ref 3.0–12.0)
Neutro Abs: 2.3 10*3/uL (ref 1.4–7.7)
Neutrophils Relative %: 54.1 % (ref 43.0–77.0)
Platelets: 180 10*3/uL (ref 150.0–400.0)
RBC: 4.83 Mil/uL (ref 4.22–5.81)
RDW: 14.2 % (ref 11.5–15.5)
WBC: 4.2 10*3/uL (ref 4.0–10.5)

## 2022-11-19 LAB — LIPID PANEL
Cholesterol: 153 mg/dL (ref 0–200)
HDL: 41.8 mg/dL (ref >39.00)
LDL Cholesterol: 98 mg/dL (ref 0–99)
NonHDL: 110.74
Total CHOL/HDL Ratio: 4
Triglycerides: 62 mg/dL (ref 0.0–149.0)
VLDL: 12.4 mg/dL (ref 0.0–40.0)

## 2022-11-19 LAB — PSA: PSA: 0.72 ng/mL (ref 0.10–4.00)

## 2022-11-19 MED ORDER — TERBINAFINE HCL 250 MG PO TABS
250.0000 mg | ORAL_TABLET | Freq: Every day | ORAL | 0 refills | Status: DC
  Filled 2022-11-19: qty 48, 48d supply, fill #0

## 2022-11-19 NOTE — Patient Instructions (Addendum)
It was great to see you! ? ?Please go to the lab for blood work.  ? ?Our office will call you with your results unless you have chosen to receive results via MyChart. ? ?If your blood work is normal we will follow-up each year for physicals and as scheduled for chronic medical problems. ? ?If anything is abnormal we will treat accordingly and get you in for a follow-up. ? ?Take care, ? ?Cristian Odom ?  ? ? ?

## 2022-11-19 NOTE — Progress Notes (Signed)
Subjective:    Cristian Odom is a 47 y.o. male and is here for a comprehensive physical exam.  HPI  There are no preventive care reminders to display for this patient.  Acute Concerns: Nail fungus: He has a nail fungus in both big toes which has worsened since last year.  He did not take the medication prescribed due to concerns of how it would affect his liver.   Warts on fingers: He complains of warts on his fingers.  He states they tend to appear every couple of years.  The warts tend to only be on his fingers.  Chronic Issues: Anxiety: He is compliant with 150 mg Wellbutrin once daily and 0.25 mg Xanax as needed.  His mood is overall stable.   Health Maintenance: Immunizations -- UTD Colonoscopy -- Last 01/09/2021. Diverticulosis. Polyps found and removed. Results were otherwise normal. Due in 2029 PSA --  Lab Results  Component Value Date   PSA 0.83 10/10/2020   Diet -- Maintains healthy diet.  Sleep habits -- No concerns.  Exercise -- Stays active.   Weight --  Recent weight history Wt Readings from Last 10 Encounters:  11/19/22 207 lb 4 oz (94 kg)  07/26/22 209 lb 12.8 oz (95.2 kg)  11/13/21 209 lb (94.8 kg)  01/09/21 214 lb (97.1 kg)  12/26/20 214 lb (97.1 kg)  10/10/20 207 lb 8 oz (94.1 kg)  12/14/19 219 lb 3.2 oz (99.4 kg)  12/03/19 215 lb 6.1 oz (97.7 kg)  09/08/19 216 lb (98 kg)  05/27/19 210 lb (95.3 kg)   Body mass index is 24.58 kg/m.  Mood -- Stable Alcohol use --  reports current alcohol use of about 5.0 - 7.0 standard drinks of alcohol per week.  Tobacco use --  Tobacco Use: Low Risk  (11/19/2022)   Patient History    Smoking Tobacco Use: Never    Smokeless Tobacco Use: Never    Passive Exposure: Not on file    Eligible for Low Dose CT? No  UTD with eye doctor? yes UTD with dentist? yes     11/19/2022    8:10 AM  Depression screen PHQ 2/9  Decreased Interest 0  Down, Depressed, Hopeless 1  PHQ - 2 Score 1  Altered sleeping 0   Tired, decreased energy 0  Change in appetite 0  Feeling bad or failure about yourself  0  Trouble concentrating 1  Moving slowly or fidgety/restless 0  Suicidal thoughts 0  PHQ-9 Score 2  Difficult doing work/chores Not difficult at all    Other providers/specialists: Patient Care Team: Jarold Motto, [place] as PCP - General (Physician Assistant)    PMHx, SurgHx, SocialHx, Medications, and Allergies were reviewed in the Visit Navigator and updated as appropriate.   Past Medical History:  Diagnosis Date   Allergy    OA (osteoarthritis) of knee, right      Past Surgical History:  Procedure Laterality Date   ANTERIOR CRUCIATE LIGAMENT REPAIR Right      Family History  Problem Relation Age of Onset   Bladder Cancer Father    Colon polyps Father    Heart attack Father 45   Colon cancer Neg Hx    Esophageal cancer Neg Hx    Rectal cancer Neg Hx    Stomach cancer Neg Hx     Social History   Tobacco Use   Smoking status: Never   Smokeless tobacco: Never  Vaping Use   Vaping Use: Never used  Substance Use  Topics   Alcohol use: Yes    Alcohol/week: 5.0 - 7.0 standard drinks of alcohol    Types: 5 - 7 Standard drinks or equivalent per week   Drug use: No    Review of Systems:   Review of Systems  Constitutional:  Negative for chills, fever, malaise/fatigue and weight loss.  HENT:  Negative for hearing loss, sinus pain and sore throat.   Respiratory:  Negative for cough and hemoptysis.   Cardiovascular:  Negative for chest pain, palpitations, leg swelling and PND.  Gastrointestinal:  Negative for abdominal pain, constipation, diarrhea, heartburn, nausea and vomiting.  Genitourinary:  Negative for dysuria, frequency and urgency.  Musculoskeletal:  Negative for back pain, myalgias and neck pain.  Skin:  Negative for itching and rash.  Neurological:  Negative for dizziness, tingling, seizures and headaches.  Endo/Heme/Allergies:  Negative for polydipsia.   Psychiatric/Behavioral:  Negative for depression. The patient is not nervous/anxious.     Objective:    Vitals:   11/19/22 0807  BP: 118/70  Pulse: 60  Temp: (!) 97.5 F (36.4 C)  SpO2: 99%    Body mass index is 24.58 kg/m.  General  Alert, cooperative, no distress, appears stated age  Head:  Normocephalic, without obvious abnormality, atraumatic  Eyes:  PERRL, conjunctiva/corneas clear, EOM's intact, fundi benign, both eyes       Ears:  Normal TM's and external ear canals, both ears  Nose: Nares normal, septum midline, mucosa normal, no drainage or sinus tenderness  Throat: Lips, mucosa, and tongue normal; teeth and gums normal  Neck: Supple, symmetrical, trachea midline, no adenopathy;     thyroid:  No enlargement/tenderness/nodules; no carotid bruit or JVD  Back:   Symmetric, no curvature, ROM normal, no CVA tenderness  Lungs:   Clear to auscultation bilaterally, respirations unlabored  Chest wall:  No tenderness or deformity  Heart:  Regular rate and rhythm, S1 and S2 normal, no murmur, rub or gallop  Abdomen:   Soft, non-tender, bowel sounds active all four quadrants, no masses, no organomegaly  Extremities: Extremities normal, atraumatic, no cyanosis or edema  Prostate : Deferred   Skin: Skin color, texture, turgor normal, no rashes  R hand with warts on middle finger x 3 Bilateral distal aspect of great toes with slightly dystrophic appearance  Lymph nodes: Cervical, supraclavicular, and axillary nodes normal  Neurologic: CNII-XII grossly intact. Normal strength, sensation and reflexes throughout   AssessmentPlan:   Encounter for general adult medical examination with abnormal findings Today patient counseled on age appropriate routine health concerns for screening and prevention, each reviewed and up to date or declined. Immunizations reviewed and up to date or declined. Labs ordered and reviewed. Risk factors for depression reviewed and negative. Hearing function  and visual acuity are intact. ADLs screened and addressed as needed. Functional ability and level of safety reviewed and appropriate. Education, counseling and referrals performed based on assessed risks today. Patient provided with a copy of personalized plan for preventive services.  Toenail fungus No red flags Update blood work and if liver labs WNL, will trial terbinafine May also consider podiatry referral  Anxiety and depression Overall controlled He is consider Wellbutrin 150 mg XL increase but will think about it and message me if he would like to trial this Denies SI/HI  Prostate cancer screening No family hx of prostate cancer or current symptoms He would like to be screened for this  Other viral warts Discussed option to treat with cryo vs offer derm referral  vs watchful waiting He is agreeable to watchful waiting He will reach out if he would like derm referral   I,Rachel Rivera,acting as a scribe for Jarold Motto, PA.,have documented all relevant documentation on the behalf of Jarold Motto, PA,as directed by  Jarold Motto, PA while in the presence of Jarold Motto, [place].  I, Jarold Motto, [place], have reviewed all documentation for this visit. The documentation on 11/19/22 for the exam, diagnosis, procedures, and orders are all accurate and complete.   Jarold Motto, PA-C [facility]

## 2022-11-22 ENCOUNTER — Other Ambulatory Visit (HOSPITAL_COMMUNITY): Payer: Self-pay

## 2022-12-19 ENCOUNTER — Other Ambulatory Visit: Payer: Self-pay | Admitting: Physician Assistant

## 2022-12-19 ENCOUNTER — Other Ambulatory Visit: Payer: Self-pay

## 2022-12-19 MED ORDER — BUPROPION HCL ER (XL) 150 MG PO TB24
150.0000 mg | ORAL_TABLET | Freq: Every day | ORAL | 2 refills | Status: DC
  Filled 2022-12-19: qty 30, 30d supply, fill #0
  Filled 2023-01-14: qty 30, 30d supply, fill #1
  Filled 2023-02-12: qty 30, 30d supply, fill #2

## 2022-12-26 ENCOUNTER — Other Ambulatory Visit (HOSPITAL_COMMUNITY): Payer: Self-pay

## 2022-12-26 ENCOUNTER — Other Ambulatory Visit: Payer: Self-pay | Admitting: Physician Assistant

## 2022-12-26 MED ORDER — TERBINAFINE HCL 250 MG PO TABS
250.0000 mg | ORAL_TABLET | Freq: Every day | ORAL | 0 refills | Status: DC
  Filled 2022-12-26 – 2023-01-01 (×2): qty 48, 48d supply, fill #0

## 2023-01-01 ENCOUNTER — Other Ambulatory Visit (HOSPITAL_COMMUNITY): Payer: Self-pay

## 2023-01-08 NOTE — Progress Notes (Signed)
Cristian Odom is a 47 y.o. male here for a new problem.  History of Present Illness:   Chief Complaint  Patient presents with   Hip Pain    Pt c/o left hip pain x several months worsening, worse at night after doing a lot of activities. Has been taking Ibuprofen.    HPI  Left Hip Pain Has been having left hip pain that has been worsening over the past few months. Radiates down left leg. Worse at night, in certain positions, stepping out of truck. Denies trauma to the area. Taking ibuprofen as needed. Still doing low-impact strength training. Denies redness, swelling in area, fever, chills, lower back pain. Does not have issues with walking.  Past Medical History:  Diagnosis Date   Allergy    OA (osteoarthritis) of knee, right      Social History   Tobacco Use   Smoking status: Never   Smokeless tobacco: Never  Vaping Use   Vaping Use: Never used  Substance Use Topics   Alcohol use: Yes    Alcohol/week: 5.0 - 7.0 standard drinks of alcohol    Types: 5 - 7 Standard drinks or equivalent per week   Drug use: No    Past Surgical History:  Procedure Laterality Date   ANTERIOR CRUCIATE LIGAMENT REPAIR Right     Family History  Problem Relation Age of Onset   Bladder Cancer Father    Colon polyps Father    Heart attack Father 28   Colon cancer Neg Hx    Esophageal cancer Neg Hx    Rectal cancer Neg Hx    Stomach cancer Neg Hx     No Known Allergies  Current Medications:   Current Outpatient Medications:    ALPRAZolam (XANAX) 0.25 MG tablet, Take 1 tablet (0.25 mg total) by mouth 2 (two) times daily as needed for anxiety., Disp: 20 tablet, Rfl: 0   buPROPion (WELLBUTRIN XL) 150 MG 24 hr tablet, Take 1 tablet (150 mg total) by mouth daily., Disp: 30 tablet, Rfl: 2   Cetirizine HCl 10 MG CAPS, Take 10 mg by mouth daily. , Disp: , Rfl:    fluticasone (FLONASE) 50 MCG/ACT nasal spray, Place 2 sprays into both nostrils daily., Disp: 16 g, Rfl: 6   MAGNESIUM CITRATE  PO, Take 1 capsule by mouth daily., Disp: , Rfl:    Multiple Vitamins-Minerals (CENTRUM ADULTS) TABS, Take 1 tablet by mouth daily in the afternoon., Disp: , Rfl:    Omega-3 Fatty Acids (FISH OIL PO), Take 2 capsules by mouth daily., Disp: , Rfl:    ondansetron (ZOFRAN-ODT) 4 MG disintegrating tablet, Dissolve 1 tablet (4 mg total) by mouth every 8 (eight) hours as needed for nausea or vomiting., Disp: 20 tablet, Rfl: 0   tadalafil (CIALIS) 20 MG tablet, TAKE 1/2 TO 1 TABLET BY MOUTH EVERY OTHER DAY AS NEEDED FOR ERECTILE DYSFUNCTION, Disp: 20 tablet, Rfl: 5   terbinafine (LAMISIL) 250 MG tablet, Take 1 tablet (250 mg total) by mouth daily., Disp: 48 tablet, Rfl: 0   Review of Systems:   Review of Systems  Constitutional:  Negative for chills, fever and malaise/fatigue.  HENT:  Negative for congestion.   Eyes:  Negative for blurred vision.  Respiratory:  Negative for cough and shortness of breath.   Cardiovascular:  Negative for chest pain, palpitations and leg swelling.  Gastrointestinal:  Negative for vomiting.  Musculoskeletal:  Positive for joint pain (Left hip). Negative for back pain.  Skin:  Negative for rash.  Neurological:  Negative for loss of consciousness and headaches.    Vitals:   Vitals:   01/15/23 0806  BP: 110/70  Pulse: (!) 59  Temp: 97.7 F (36.5 C)  TempSrc: Temporal  SpO2: 98%  Weight: 208 lb 8 oz (94.6 kg)  Height: 6\' 5"  (1.956 m)     Body mass index is 24.72 kg/m.  Physical Exam:   Physical Exam Vitals and nursing note reviewed.  Constitutional:      Appearance: He is well-developed.  HENT:     Head: Normocephalic.  Eyes:     Conjunctiva/sclera: Conjunctivae normal.     Pupils: Pupils are equal, round, and reactive to light.  Pulmonary:     Effort: Pulmonary effort is normal.  Musculoskeletal:        General: Normal range of motion.     Cervical back: Normal range of motion.     Comments: Normal ROM overall of left hip No tenderness to  palpation   Skin:    General: Skin is warm and dry.  Neurological:     Mental Status: He is alert and oriented to person, place, and time.     Comments: Strength of left hip within normal limits  Psychiatric:        Behavior: Behavior normal.        Thought Content: Thought content normal.        Judgment: Judgment normal.     Assessment and Plan:   Left hip pain No red flags Suspect possible bursitis Recommendations as follows: Try the meloxicam 15 mg daily x 1 week Avoid additional ibuprofen while taking this Trial the exercises If you aren't getting sufficient relief, message me and we will get you to sports medicine!    I,Alexander Ruley,acting as a Neurosurgeon for [employer], PA.,have documented all relevant documentation on the behalf of Jarold Motto, PA,as directed by  Jarold Motto, PA while in the presence of Jarold Motto, [place].   I, Jarold Motto, [place], have reviewed all documentation for this visit. The documentation on 01/15/23 for the exam, diagnosis, procedures, and orders are all accurate and complete.    Jarold Motto, PA-C

## 2023-01-14 ENCOUNTER — Other Ambulatory Visit (HOSPITAL_COMMUNITY): Payer: Self-pay

## 2023-01-15 ENCOUNTER — Encounter: Payer: Self-pay | Admitting: Physician Assistant

## 2023-01-15 ENCOUNTER — Ambulatory Visit: Payer: 59 | Admitting: Physician Assistant

## 2023-01-15 VITALS — BP 110/70 | HR 59 | Temp 97.7°F | Ht 77.0 in | Wt 208.5 lb

## 2023-01-15 DIAGNOSIS — M25552 Pain in left hip: Secondary | ICD-10-CM

## 2023-01-15 MED ORDER — MELOXICAM 15 MG PO TABS: ORAL_TABLET | ORAL | 0 refills | Status: DC

## 2023-01-15 NOTE — Patient Instructions (Signed)
It was great to see you!  Try the meloxicam 15 mg daily x 1 week Avoid additional ibuprofen while taking this Trial the exercises  If you aren't getting sufficient relief, message me and we will get you to sports medicine!  Take care,  Jarold Motto PA-C

## 2023-02-11 ENCOUNTER — Other Ambulatory Visit: Payer: Self-pay | Admitting: Physician Assistant

## 2023-02-12 ENCOUNTER — Encounter: Payer: Self-pay | Admitting: Physician Assistant

## 2023-02-12 ENCOUNTER — Other Ambulatory Visit: Payer: Self-pay | Admitting: Physician Assistant

## 2023-02-12 ENCOUNTER — Other Ambulatory Visit (HOSPITAL_COMMUNITY): Payer: Self-pay

## 2023-02-12 DIAGNOSIS — Z79899 Other long term (current) drug therapy: Secondary | ICD-10-CM

## 2023-02-12 MED ORDER — TERBINAFINE HCL 250 MG PO TABS: 250.0000 mg | ORAL_TABLET | Freq: Every day | ORAL | 0 refills | Status: DC

## 2023-02-13 ENCOUNTER — Other Ambulatory Visit (HOSPITAL_COMMUNITY): Payer: Self-pay

## 2023-02-16 ENCOUNTER — Other Ambulatory Visit: Payer: Self-pay | Admitting: Physician Assistant

## 2023-02-17 ENCOUNTER — Other Ambulatory Visit (HOSPITAL_COMMUNITY): Payer: Self-pay

## 2023-02-17 MED ORDER — ONDANSETRON 4 MG PO TBDP
4.0000 mg | ORAL_TABLET | Freq: Three times a day (TID) | ORAL | 0 refills | Status: DC | PRN
  Filled 2023-02-17: qty 20, 7d supply, fill #0

## 2023-02-19 ENCOUNTER — Other Ambulatory Visit (INDEPENDENT_AMBULATORY_CARE_PROVIDER_SITE_OTHER): Payer: 59

## 2023-02-19 DIAGNOSIS — Z79899 Other long term (current) drug therapy: Secondary | ICD-10-CM | POA: Diagnosis not present

## 2023-02-19 LAB — COMPREHENSIVE METABOLIC PANEL
ALT: 27 U/L (ref 0–53)
AST: 32 U/L (ref 0–37)
Albumin: 4 g/dL (ref 3.5–5.2)
Alkaline Phosphatase: 58 U/L (ref 39–117)
BUN: 20 mg/dL (ref 6–23)
CO2: 28 mEq/L (ref 19–32)
Calcium: 9.3 mg/dL (ref 8.4–10.5)
Chloride: 106 mEq/L (ref 96–112)
Creatinine, Ser: 1.11 mg/dL (ref 0.40–1.50)
GFR: 78.09 mL/min (ref >60.00)
Glucose, Bld: 90 mg/dL (ref 70–99)
Potassium: 4.1 mEq/L (ref 3.5–5.1)
Sodium: 141 mEq/L (ref 135–145)
Total Bilirubin: 0.5 mg/dL (ref 0.2–1.2)
Total Protein: 6.6 g/dL (ref 6.0–8.3)

## 2023-03-11 DIAGNOSIS — B079 Viral wart, unspecified: Secondary | ICD-10-CM | POA: Diagnosis not present

## 2023-03-11 DIAGNOSIS — B351 Tinea unguium: Secondary | ICD-10-CM | POA: Diagnosis not present

## 2023-03-12 ENCOUNTER — Other Ambulatory Visit: Payer: Self-pay | Admitting: Physician Assistant

## 2023-03-14 ENCOUNTER — Other Ambulatory Visit: Payer: Self-pay

## 2023-03-14 ENCOUNTER — Other Ambulatory Visit (HOSPITAL_COMMUNITY): Payer: Self-pay

## 2023-03-14 MED ORDER — BUPROPION HCL ER (XL) 150 MG PO TB24
150.0000 mg | ORAL_TABLET | Freq: Every day | ORAL | 2 refills | Status: DC
  Filled 2023-03-14: qty 30, 30d supply, fill #0
  Filled 2023-04-11: qty 30, 30d supply, fill #1
  Filled 2023-05-09: qty 30, 30d supply, fill #2

## 2023-04-11 ENCOUNTER — Other Ambulatory Visit (HOSPITAL_COMMUNITY): Payer: Self-pay

## 2023-05-09 ENCOUNTER — Other Ambulatory Visit (HOSPITAL_COMMUNITY): Payer: Self-pay

## 2023-06-06 ENCOUNTER — Other Ambulatory Visit: Payer: Self-pay

## 2023-06-06 ENCOUNTER — Other Ambulatory Visit: Payer: Self-pay | Admitting: Physician Assistant

## 2023-06-06 ENCOUNTER — Other Ambulatory Visit (HOSPITAL_COMMUNITY): Payer: Self-pay

## 2023-06-06 MED ORDER — BUPROPION HCL ER (XL) 150 MG PO TB24
150.0000 mg | ORAL_TABLET | Freq: Every day | ORAL | 5 refills | Status: DC
  Filled 2023-06-06: qty 30, 30d supply, fill #0

## 2023-06-09 ENCOUNTER — Encounter: Payer: Self-pay | Admitting: Physician Assistant

## 2023-06-09 DIAGNOSIS — N528 Other male erectile dysfunction: Secondary | ICD-10-CM

## 2023-06-10 ENCOUNTER — Other Ambulatory Visit (HOSPITAL_COMMUNITY): Payer: Self-pay

## 2023-06-10 ENCOUNTER — Other Ambulatory Visit: Payer: Self-pay | Admitting: Physician Assistant

## 2023-06-10 MED ORDER — TADALAFIL 20 MG PO TABS
ORAL_TABLET | ORAL | 5 refills | Status: DC
Start: 2023-06-10 — End: 2024-06-28

## 2023-06-10 MED ORDER — BUPROPION HCL ER (XL) 300 MG PO TB24
300.0000 mg | ORAL_TABLET | Freq: Every day | ORAL | 1 refills | Status: DC
  Filled 2023-06-10 – 2023-08-01 (×3): qty 90, 90d supply, fill #0
  Filled 2023-10-22: qty 90, 90d supply, fill #1

## 2023-06-10 MED ORDER — BUPROPION HCL ER (XL) 300 MG PO TB24: 300.0000 mg | ORAL_TABLET | Freq: Every day | ORAL | 1 refills | Status: DC

## 2023-06-10 MED ORDER — TADALAFIL 20 MG PO TABS
ORAL_TABLET | ORAL | 5 refills | Status: DC
Start: 2023-06-10 — End: 2023-06-10

## 2023-06-10 NOTE — Addendum Note (Signed)
Addended by: Jimmye Norman on: 06/10/2023 01:50 PM   Modules accepted: Orders

## 2023-06-10 NOTE — Telephone Encounter (Signed)
Called [facility] pharmacy spoke to Tiffany told her to cancel  Wellbutrin and Cialis, sent to wrong pharmacy. Tiffany verbalized understanding.

## 2023-06-13 ENCOUNTER — Other Ambulatory Visit (HOSPITAL_COMMUNITY): Payer: Self-pay

## 2023-07-27 ENCOUNTER — Other Ambulatory Visit: Payer: Self-pay | Admitting: Physician Assistant

## 2023-07-28 ENCOUNTER — Other Ambulatory Visit: Payer: Self-pay

## 2023-07-28 ENCOUNTER — Other Ambulatory Visit (HOSPITAL_COMMUNITY): Payer: Self-pay

## 2023-07-28 MED ORDER — ONDANSETRON 4 MG PO TBDP
4.0000 mg | ORAL_TABLET | Freq: Three times a day (TID) | ORAL | 0 refills | Status: AC | PRN
  Filled 2023-07-28 – 2023-08-01 (×2): qty 20, 7d supply, fill #0

## 2023-07-28 MED ORDER — ALPRAZOLAM 0.25 MG PO TABS
0.2500 mg | ORAL_TABLET | Freq: Two times a day (BID) | ORAL | 0 refills | Status: DC | PRN
  Filled 2023-07-28 – 2023-08-01 (×2): qty 20, 10d supply, fill #0

## 2023-07-28 NOTE — Telephone Encounter (Signed)
Pt requesting refill for Alprazolam 0.25 mg. Last OV 01/2023.

## 2023-07-31 ENCOUNTER — Other Ambulatory Visit (HOSPITAL_COMMUNITY): Payer: Self-pay

## 2023-08-01 ENCOUNTER — Other Ambulatory Visit: Payer: Self-pay

## 2023-08-01 ENCOUNTER — Encounter: Payer: Self-pay | Admitting: Physician Assistant

## 2023-08-01 ENCOUNTER — Other Ambulatory Visit (HOSPITAL_COMMUNITY): Payer: Self-pay

## 2023-08-01 DIAGNOSIS — E7800 Pure hypercholesterolemia, unspecified: Secondary | ICD-10-CM

## 2023-08-05 ENCOUNTER — Telehealth (HOSPITAL_BASED_OUTPATIENT_CLINIC_OR_DEPARTMENT_OTHER): Payer: Self-pay | Admitting: Physician Assistant

## 2023-08-29 ENCOUNTER — Ambulatory Visit (HOSPITAL_BASED_OUTPATIENT_CLINIC_OR_DEPARTMENT_OTHER)
Admission: RE | Admit: 2023-08-29 | Discharge: 2023-08-29 | Disposition: A | Discharge status: Home / Self Care | Payer: Commercial Managed Care - PPO | Source: Ambulatory Visit | Attending: Physician Assistant | Admitting: Physician Assistant

## 2023-08-29 DIAGNOSIS — E7800 Pure hypercholesterolemia, unspecified: Secondary | ICD-10-CM | POA: Insufficient documentation

## 2023-09-01 ENCOUNTER — Encounter: Payer: Self-pay | Admitting: Physician Assistant

## 2023-09-01 ENCOUNTER — Other Ambulatory Visit: Payer: Self-pay | Admitting: Physician Assistant

## 2023-09-01 DIAGNOSIS — R931 Abnormal findings on diagnostic imaging of heart and coronary circulation: Secondary | ICD-10-CM

## 2023-10-22 ENCOUNTER — Other Ambulatory Visit (HOSPITAL_COMMUNITY): Payer: Self-pay

## 2023-10-22 ENCOUNTER — Ambulatory Visit: Payer: Self-pay

## 2023-10-22 NOTE — Telephone Encounter (Signed)
 Copied from CRM [id number]. Topic: Clinical - Red Word Triage >> Oct 22, 2023  1:33 PM Florestine Avers wrote: Red Word that prompted transfer to Nurse Triage: Patient is having sharp chest pain for the past 24 hours.   Chief Complaint: Chest pain Symptoms: Sharp mid-chest pain  Frequency: Intermittent  Pertinent Negatives: Patient denies back pain, shortness of breath, sweatiness, or any other symptom  Disposition: [] ED /[] Urgent Care (no appt availability in office) / [x] Appointment(In office/virtual)/ []  [place] [facility]/ [] Home Care/ [] Refused Recommended Disposition /[] [place] [facility]/ []  Follow-up with PCP Additional Notes: Patient reports that he began to experience intermittent sharp chest pain yesterday. He states that his pain last only a few seconds but occurs fairly regularly. He denies any shortness of breath, sweatiness, or other symptoms with his pain. Appointment made for the patient tomorrow per his request. Patient instructed to call back for new or worsening symptoms. Patient verbalized understanding and agreement with this plan.     Reason for Disposition  Chest pain(s) lasting a few seconds  Answer Assessment - Initial Assessment Questions 1. LOCATION: "Where does it hurt?"       Mid to left sided chest  2. RADIATION: "Does the pain go anywhere else?" (e.g., into neck, jaw, arms, back)     No 3. ONSET: "When did the chest pain begin?" (Minutes, hours or days)      24 hours  4. PATTERN: "Does the pain come and go, or has it been constant since it started?"  "Does it get worse with exertion?"      Intermittent  5. DURATION: "How long does it last" (e.g., seconds, minutes, hours)     Seconds  6. SEVERITY: "How bad is the pain?"  (e.g., Scale 1-10; mild, moderate, or severe)    - MILD (1-3): doesn't interfere with normal activities     - MODERATE (4-7): interferes with normal activities or awakens from sleep    - SEVERE (8-10): excruciating pain, unable to do any  normal activities       7/10 7. CARDIAC RISK FACTORS: "Do you have any history of heart problems or risk factors for heart disease?" (e.g., angina, prior heart attack; diabetes, high blood pressure, high cholesterol, smoker, or strong family history of heart disease)     No 8. PULMONARY RISK FACTORS: "Do you have any history of lung disease?"  (e.g., blood clots in lung, asthma, emphysema, birth control pills)     No 9. CAUSE: "What do you think is causing the chest pain?"     Unsure  10. OTHER SYMPTOMS: "Do you have any other symptoms?" (e.g., dizziness, nausea, vomiting, sweating, fever, difficulty breathing, cough)       No  Protocols used: Chest Pain-A-AH

## 2023-10-22 NOTE — Telephone Encounter (Signed)
FYI, see Triage note. Pt scheduled tomorrow.

## 2023-10-23 ENCOUNTER — Ambulatory Visit: Admitting: Physician Assistant

## 2023-10-23 ENCOUNTER — Encounter: Payer: Self-pay | Admitting: Physician Assistant

## 2023-10-23 VITALS — BP 126/70 | HR 56 | Temp 97.7°F | Ht 77.0 in | Wt 203.2 lb

## 2023-10-23 DIAGNOSIS — R079 Chest pain, unspecified: Secondary | ICD-10-CM

## 2023-10-23 DIAGNOSIS — M25552 Pain in left hip: Secondary | ICD-10-CM | POA: Diagnosis not present

## 2023-10-23 DIAGNOSIS — B351 Tinea unguium: Secondary | ICD-10-CM | POA: Diagnosis not present

## 2023-10-23 DIAGNOSIS — R931 Abnormal findings on diagnostic imaging of heart and coronary circulation: Secondary | ICD-10-CM | POA: Diagnosis not present

## 2023-10-23 LAB — CBC WITH DIFFERENTIAL/PLATELET
Basophils Absolute: 0 10*3/uL (ref 0.0–0.1)
Basophils Relative: 0.6 % (ref 0.0–3.0)
Eosinophils Absolute: 0.2 10*3/uL (ref 0.0–0.7)
Eosinophils Relative: 6.7 % — ABNORMAL HIGH (ref 0.0–5.0)
HCT: 45.3 % (ref 39.0–52.0)
Hemoglobin: 15.2 g/dL (ref 13.0–17.0)
Lymphocytes Relative: 37.5 % (ref 12.0–46.0)
Lymphs Abs: 1.4 10*3/uL (ref 0.7–4.0)
MCHC: 33.4 g/dL (ref 30.0–36.0)
MCV: 90.5 fl (ref 78.0–100.0)
Monocytes Absolute: 0.4 10*3/uL (ref 0.1–1.0)
Monocytes Relative: 10.1 % (ref 3.0–12.0)
Neutro Abs: 1.7 10*3/uL (ref 1.4–7.7)
Neutrophils Relative %: 45.1 % (ref 43.0–77.0)
Platelets: 174 10*3/uL (ref 150.0–400.0)
RBC: 5.01 Mil/uL (ref 4.22–5.81)
RDW: 14.1 % (ref 11.5–15.5)
WBC: 3.7 10*3/uL — ABNORMAL LOW (ref 4.0–10.5)

## 2023-10-23 LAB — LIPID PANEL
Cholesterol: 182 mg/dL (ref 0–200)
HDL: 56.1 mg/dL (ref >39.00)
LDL Cholesterol: 116 mg/dL — ABNORMAL HIGH (ref 0–99)
NonHDL: 126.29
Total CHOL/HDL Ratio: 3
Triglycerides: 53 mg/dL (ref 0.0–149.0)
VLDL: 10.6 mg/dL (ref 0.0–40.0)

## 2023-10-23 LAB — COMPREHENSIVE METABOLIC PANEL
ALT: 18 U/L (ref 0–53)
AST: 22 U/L (ref 0–37)
Albumin: 4.6 g/dL (ref 3.5–5.2)
Alkaline Phosphatase: 49 U/L (ref 39–117)
BUN: 21 mg/dL (ref 6–23)
CO2: 28 meq/L (ref 19–32)
Calcium: 9.6 mg/dL (ref 8.4–10.5)
Chloride: 104 meq/L (ref 96–112)
Creatinine, Ser: 1.27 mg/dL (ref 0.40–1.50)
GFR: 66.12 mL/min (ref >60.00)
Glucose, Bld: 88 mg/dL (ref 70–99)
Potassium: 4.7 meq/L (ref 3.5–5.1)
Sodium: 140 meq/L (ref 135–145)
Total Bilirubin: 0.5 mg/dL (ref 0.2–1.2)
Total Protein: 7 g/dL (ref 6.0–8.3)

## 2023-10-23 LAB — TSH: TSH: 1.37 u[IU]/mL (ref 0.35–5.50)

## 2023-10-23 NOTE — Progress Notes (Signed)
 Cristian Odom is a 48 y.o. male here for a new problem.  History of Present Illness:   Chief Complaint  Patient presents with   Chest Pain    Pt c/o sharp pain off and on past 36 hours, no headaches, no dizziness. This morning left hand feels a little tight.   Hip Pain    Pt still c/o left hip pain.   Chest Pain  Patient complains of intermittent chest pain that has persisted for the past 36 hours.  He describes his symptoms as short and sharp pains and tends to last for 2-3 minutes. They would occasionally last for a longer period at times and at other times he would not experience any symptoms for 3-4 hours.   Pain is located around his mid chest. He states that he felt a tightness in his left hand this morning.  Can't seem to identify any possible triggers or new medications or supplements.  Reports compliance and good tolerance of Wellbutrin XL 300 mg, denies missing any doses.  Denies any headaches, dizziness, heartburn, reflux,  or increased stress.  EKG done today at office is normal.   Hip Pain  Patient continues to complain of hip pain, stating that he feels that his hip seems "semi disconnected".  It seems like his leg/ hip would often feel out of balance.  He occasionally takes meloxicam to relief his pain but is not able the last time he did.  He admits that he wasn't compliant with the recommended exercises.  Agreeable to a sports medicine referral today.   Calcium Score  Patient was advised to start a low dose aspirin based on his most recent calcium score, however, he denies initiating that.  He is scheduled for a follow up with cardiology soon.   Toe Fungus  Patient complains of dark discoloration in his right great toe.  He reports noticing that his nail feels thick and hard when grooming it.  Denies being a regular runner, does however walk quite often.  Plans to follow up with dermatology.   Past Medical History:  Diagnosis Date   Allergy    OA  (osteoarthritis) of knee, right      Social History   Tobacco Use   Smoking status: Never   Smokeless tobacco: Never  Vaping Use   Vaping status: Never Used  Substance Use Topics   Alcohol use: Yes    Alcohol/week: 5.0 - 7.0 standard drinks of alcohol    Types: 5 - 7 Standard drinks or equivalent per week   Drug use: No    Past Surgical History:  Procedure Laterality Date   ANTERIOR CRUCIATE LIGAMENT REPAIR Right     Family History  Problem Relation Age of Onset   Bladder Cancer Father    Colon polyps Father    Heart attack Father 16   Colon cancer Neg Hx    Esophageal cancer Neg Hx    Rectal cancer Neg Hx    Stomach cancer Neg Hx     No Known Allergies  Current Medications:   Current Outpatient Medications:    ALPRAZolam (XANAX) 0.25 MG tablet, Take 1 tablet (0.25 mg total) by mouth 2 (two) times daily as needed for anxiety., Disp: 20 tablet, Rfl: 0   buPROPion (WELLBUTRIN XL) 300 MG 24 hr tablet, Take 1 tablet (300 mg total) by mouth daily., Disp: 90 tablet, Rfl: 1   Cetirizine HCl 10 MG CAPS, Take 10 mg by mouth daily. , Disp: , Rfl:  fluticasone (FLONASE) 50 MCG/ACT nasal spray, Place 2 sprays into both nostrils daily., Disp: 16 g, Rfl: 6   MAGNESIUM CITRATE PO, Take 1 capsule by mouth daily., Disp: , Rfl:    meloxicam (MOBIC) 15 MG tablet, TAKE 1 TABLET DAILY X 1 WEEK THEN AS NEEDED AFTER THAT, Disp: 30 tablet, Rfl: 0   Multiple Vitamins-Minerals (CENTRUM ADULTS) TABS, Take 1 tablet by mouth daily in the afternoon., Disp: , Rfl:    ondansetron (ZOFRAN-ODT) 4 MG disintegrating tablet, Dissolve 1 tablet (4 mg total) by mouth every 8 (eight) hours as needed for nausea or vomiting., Disp: 20 tablet, Rfl: 0   tadalafil (CIALIS) 20 MG tablet, TAKE 1/2 TO 1 TABLET BY MOUTH EVERY OTHER DAY AS NEEDED FOR ERECTILE DYSFUNCTION, Disp: 20 tablet, Rfl: 5   Review of Systems:   Review of Systems  Cardiovascular:  Positive for chest pain.  Gastrointestinal:  Negative for  heartburn.  Musculoskeletal:  Positive for joint pain.  Neurological:  Negative for dizziness and headaches.  Psychiatric/Behavioral:  The patient is not nervous/anxious.     Vitals:   Vitals:   10/23/23 0844  BP: 126/70  Pulse: (!) 56  Temp: 97.7 F (36.5 C)  TempSrc: Temporal  SpO2: 99%  Weight: 203 lb 4 oz (92.2 kg)  Height: 6\' 5"  (1.956 m)     Body mass index is 24.1 kg/m.  Physical Exam:   Physical Exam Vitals and nursing note reviewed.  Constitutional:      General: He is not in acute distress.    Appearance: He is well-developed. He is not ill-appearing or toxic-appearing.  Cardiovascular:     Rate and Rhythm: Normal rate and regular rhythm.     Pulses: Normal pulses.     Heart sounds: Normal heart sounds, S1 normal and S2 normal.  Pulmonary:     Effort: Pulmonary effort is normal.     Breath sounds: Normal breath sounds.  Skin:    General: Skin is warm and dry.     Comments: R great toe with hyperpigmented line in middle of nail and dystrophic tip/edge of nail  Neurological:     Mental Status: He is alert.     GCS: GCS eye subscore is 4. GCS verbal subscore is 5. GCS motor subscore is 6.  Psychiatric:        Speech: Speech normal.        Behavior: Behavior normal. Behavior is cooperative.     Assessment and Plan:   Chest pain, unspecified type EKG tracing is personally reviewed.  EKG notes NSR.  No acute changes.  No evidence of end-organ damage on my exam Unclear etiology but do not suspect acute coronary syndrome at this time Recommend daily low dose aspirin  Continued monitoring of symptom(s) Recommend statin however he would like to consider this If new/worsening symptom(s), recommend ER evaluation  Left hip pain Referral to sports medicine for further evaluation  Elevated coronary artery calcium score Recommended aspirin and statin Will update lipid panel today and provide recommendations Close follow up with cardiology soon  Toenail  fungus Discussed need to review nail changes with dermatology - he has an established dermatologist   Jarold Motto, PA-C  I,Safa Hewitt Shorts as a scribe for Jarold Motto, PA.,have documented all relevant documentation on the behalf of Jarold Motto, PA,as directed by  Jarold Motto, PA while in the presence of Jarold Motto, [place].   I, Jarold Motto, [place], have reviewed all documentation for this visit. The documentation on  10/23/23 for the exam, diagnosis, procedures, and orders are all accurate and complete.

## 2023-10-23 NOTE — Patient Instructions (Addendum)
 It was great to see you!  Please start 81 mg aspirin  I typically recommend statin to get your LDL under 70 -- we can recheck cholesterol panel today and we can consider this  Continue plan to see cardiology If any worsening or new symptom(s), please go to the ER for evaluation  Please follow up with dermatology regarding your toenail abnormality   We will refer you to sports medicine A referral has been placed for you to see one of our fantastic providers at [facility]. Someone from their office will be in touch soon regarding scheduling your appointment. Their location:  [facility]  [address] on the 1st floor Phone number [phone] Fax [phone].   This location is across the street from the entrance to [employer] and in the same complex as the [facility]  Take care,  Jarold Motto PA-C

## 2023-10-24 ENCOUNTER — Encounter: Payer: Self-pay | Admitting: Physician Assistant

## 2023-11-04 NOTE — Progress Notes (Signed)
 CARDIOLOGY CONSULT NOTE       Patient ID: Cristian Odom MRN: [id number] DOB/AGE: [birth year] 50 y.o.  Admit date: (Not on file) Referring Physician: Jarold Motto PA Primary Physician: Jarold Motto, PA Primary Cardiologist: New Reason for Consultation: CAD/chest pain    HPI:  48 y.o. referred by Jarold Motto PA for CAD and elevated calcium score. Seen in office 10/23/23 had sharp pain in chest 2 days also had left hip pain. Short sharp pains lasting 2-3 minutes He takes welbutrin for anxiety/depression. ECG in there office was normal Referred to sports med for left hip pain. Uses meloxicam for relief. He's had prior right anterior cruciate ligament repair. Father had heart attack at age 50   Recommended ASA. Deferred starting statin LDL 116 with TC 182   Reviewed his calcium score from 09/03/23 no extracardiac finding total calcium score was 37.1 involving mostly RCA and small amount in LAD This was 82 nd percentile Aorta measured 3.8 cm   He is active with no angina. Works out at [employer]. Does mortgage appraisals. On second marriage as is wife who is a Insurance underwriter at [employer]. Two teenagers at home   Discussed primary prevention with LDL < 70 , 81 mg ASA. At young age hesitant to use high dose statins. Discussed using low dose lipitor M/W/F to get LDL < 70 and further risk stratifying with LpA  ROS All other systems reviewed and negative except as noted above  Past Medical History:  Diagnosis Date   Allergy    OA (osteoarthritis) of knee, right     Family History  Problem Relation Age of Onset   Bladder Cancer Father    Colon polyps Father    Heart attack Father 35   Colon cancer Neg Hx    Esophageal cancer Neg Hx    Rectal cancer Neg Hx    Stomach cancer Neg Hx     Social History   Socioeconomic History   Marital status: Married    Spouse name: Not on file   Number of children: Not on file   Years of education: Not on file   Highest education level: Not on  file  Occupational History   Not on file  Tobacco Use   Smoking status: Never   Smokeless tobacco: Never  Vaping Use   Vaping status: Never Used  Substance and Sexual Activity   Alcohol use: Yes    Alcohol/week: 5.0 - 7.0 standard drinks of alcohol    Types: 5 - 7 Standard drinks or equivalent per week   Drug use: No   Sexual activity: Not on file  Other Topics Concern   Not on file  Social History Narrative   Wife is an Charity fundraiser. Married in 2017.    Combined family with four children, adolescents.    Works as Technical sales engineer.       Social Drivers of Corporate investment banker Strain: Not on file  Food Insecurity: Not on file  Transportation Needs: Not on file  Physical Activity: Not on file  Stress: Not on file  Social Connections: Not on file  Intimate Partner Violence: Not on file    Past Surgical History:  Procedure Laterality Date   ANTERIOR CRUCIATE LIGAMENT REPAIR Right       Current Outpatient Medications:    ALPRAZolam (XANAX) 0.25 MG tablet, Take 1 tablet (0.25 mg total) by mouth 2 (two) times daily as needed for anxiety., Disp: 20 tablet, Rfl: 0   buPROPion (  WELLBUTRIN XL) 300 MG 24 hr tablet, Take 1 tablet (300 mg total) by mouth daily., Disp: 90 tablet, Rfl: 1   Cetirizine HCl 10 MG CAPS, Take 10 mg by mouth daily. , Disp: , Rfl:    fluticasone (FLONASE) 50 MCG/ACT nasal spray, Place 2 sprays into both nostrils daily., Disp: 16 g, Rfl: 6   MAGNESIUM CITRATE PO, Take 1 capsule by mouth daily., Disp: , Rfl:    meloxicam (MOBIC) 15 MG tablet, TAKE 1 TABLET DAILY X 1 WEEK THEN AS NEEDED AFTER THAT, Disp: 30 tablet, Rfl: 0   Multiple Vitamins-Minerals (CENTRUM ADULTS) TABS, Take 1 tablet by mouth daily in the afternoon., Disp: , Rfl:    ondansetron (ZOFRAN-ODT) 4 MG disintegrating tablet, Dissolve 1 tablet (4 mg total) by mouth every 8 (eight) hours as needed for nausea or vomiting., Disp: 20 tablet, Rfl: 0   tadalafil (CIALIS) 20 MG tablet, TAKE 1/2 TO 1  TABLET BY MOUTH EVERY OTHER DAY AS NEEDED FOR ERECTILE DYSFUNCTION, Disp: 20 tablet, Rfl: 5    Physical Exam: There were no vitals taken for this visit.    Affect appropriate Healthy:  appears stated age HEENT: normal Neck supple with no adenopathy JVP normal no bruits no thyromegaly Lungs clear with no wheezing and good diaphragmatic motion Heart:  S1/S2 no murmur, no rub, gallop or click PMI normal Abdomen: benighn, BS positve, no tenderness, no AAA no bruit.  No HSM or HJR Distal pulses intact with no bruits No edema Neuro non-focal Skin warm and dry No muscular weakness   Labs:   Lab Results  Component Value Date   WBC 3.7 (L) 10/23/2023   HGB 15.2 10/23/2023   HCT 45.3 10/23/2023   MCV 90.5 10/23/2023   PLT 174.0 10/23/2023   No results for input(s): "NA", "K", "CL", "CO2", "BUN", "CREATININE", "CALCIUM", "PROT", "BILITOT", "ALKPHOS", "ALT", "AST", "GLUCOSE" in the last 168 hours.  Invalid input(s): "LABALBU" Lab Results  Component Value Date   CKTOTAL 136 11/13/2021    Lab Results  Component Value Date   CHOL 182 10/23/2023   CHOL 153 11/19/2022   CHOL 171 11/13/2021   Lab Results  Component Value Date   HDL 56.10 10/23/2023   HDL 41.80 11/19/2022   HDL 46.90 11/13/2021   Lab Results  Component Value Date   LDLCALC 116 (H) 10/23/2023   LDLCALC 98 11/19/2022   LDLCALC 113 (H) 11/13/2021   Lab Results  Component Value Date   TRIG 53.0 10/23/2023   TRIG 62.0 11/19/2022   TRIG 59.0 11/13/2021   Lab Results  Component Value Date   CHOLHDL 3 10/23/2023   CHOLHDL 4 11/19/2022   CHOLHDL 4 11/13/2021   No results found for: "LDLDIRECT"    Radiology: No results found.  EKG: 10/23/23  SR rate 52 normal    ASSESSMENT AND PLAN:   Chest Pain: atypical with normal ECG. No non cardiac findings on over read of chest by radiology. Elevated calcium score for age with LDL  77 and father with MI age 11. Check LpA to further risk stratify.   HLD:   discussed taking statin 3x week given young age but elevated calcium score Lipitor 10 mg M/W/F  Lipitor 10 mg M/W/F LpA repeat lipids in 2 months    F/U in a year   Signed: Charlton Haws 11/04/2023, 1:26 PM

## 2023-11-05 ENCOUNTER — Other Ambulatory Visit: Payer: Self-pay | Admitting: Physician Assistant

## 2023-11-05 ENCOUNTER — Other Ambulatory Visit: Payer: Self-pay

## 2023-11-05 DIAGNOSIS — J301 Allergic rhinitis due to pollen: Secondary | ICD-10-CM

## 2023-11-05 MED ORDER — MELOXICAM 15 MG PO TABS
ORAL_TABLET | ORAL | 0 refills | Status: DC
  Filled 2023-11-05: qty 30, 30d supply, fill #0

## 2023-11-05 MED ORDER — FLUTICASONE PROPIONATE 50 MCG/ACT NA SUSP
2.0000 | Freq: Every day | NASAL | 6 refills | Status: AC
  Filled 2023-11-05: qty 16, 30d supply, fill #0
  Filled 2023-12-04: qty 16, 30d supply, fill #1
  Filled 2024-01-26: qty 16, 30d supply, fill #2
  Filled 2024-03-24: qty 16, 30d supply, fill #3

## 2023-11-10 ENCOUNTER — Encounter (HOSPITAL_COMMUNITY): Payer: Self-pay

## 2023-11-10 ENCOUNTER — Other Ambulatory Visit: Payer: Self-pay

## 2023-11-10 ENCOUNTER — Encounter: Payer: Self-pay | Admitting: Cardiovascular Disease

## 2023-11-10 ENCOUNTER — Other Ambulatory Visit (HOSPITAL_COMMUNITY): Payer: Self-pay

## 2023-11-10 ENCOUNTER — Ambulatory Visit: Payer: Commercial Managed Care - PPO | Attending: Cardiovascular Disease | Admitting: Cardiovascular Disease

## 2023-11-10 VITALS — BP 120/78 | HR 64 | Ht 77.0 in | Wt 208.8 lb

## 2023-11-10 DIAGNOSIS — R0789 Other chest pain: Secondary | ICD-10-CM

## 2023-11-10 DIAGNOSIS — E785 Hyperlipidemia, unspecified: Secondary | ICD-10-CM | POA: Diagnosis not present

## 2023-11-10 DIAGNOSIS — Z8249 Family history of ischemic heart disease and other diseases of the circulatory system: Secondary | ICD-10-CM

## 2023-11-10 MED ORDER — ATORVASTATIN CALCIUM 10 MG PO TABS
10.0000 mg | ORAL_TABLET | Freq: Every day | ORAL | 3 refills | Status: AC
  Filled 2023-11-10: qty 90, 90d supply, fill #0
  Filled 2024-05-24 – 2024-05-25 (×2): qty 90, 90d supply, fill #1

## 2023-11-10 NOTE — Patient Instructions (Signed)
 Medication Instructions:  Your physician recommends that you continue on your current medications as directed. Please refer to the Current Medication list given to you today.  *If you need a refill on your cardiac medications before your next appointment, please call your pharmacy*  Lab Work: Your physician recommends that you return for lab work in: 3 months for fasting lipid and liver panel, and Lipoprotein A  If you have labs (blood work) drawn today and your tests are completely normal, you will receive your results only by: MyChart Message (if you have MyChart) OR A paper copy in the mail If you have any lab test that is abnormal or we need to change your treatment, we will call you to review the results.  Testing/Procedures: None ordered today.  Follow-Up: At [facility], you and your health needs are our priority.  As part of our continuing mission to provide you with exceptional heart care, our providers are all part of one team.  This team includes your primary Cardiologist (physician) and Advanced Practice Providers or APPs (Physician Assistants and Nurse Practitioners) who all work together to provide you with the care you need, when you need it.  Your next appointment:   1 year(s)  Provider:   Charlton Haws, MD     We recommend signing up for the patient portal called "MyChart".  Sign up information is provided on this After Visit Summary.  MyChart is used to connect with patients for Virtual Visits (Telemedicine).  Patients are able to view lab/test results, encounter notes, upcoming appointments, etc.  Non-urgent messages can be sent to your provider as well.   To learn more about what you can do with MyChart, go to [email or web address].   Other Instructions       1st Floor: - Lobby - Registration  - Pharmacy  - Lab - Cafe  2nd Floor: - PV Lab - Diagnostic Testing (echo, CT, nuclear med)  3rd Floor: - Vacant  4th Floor: - TCTS (cardiothoracic  surgery) - AFib Clinic - Structural Heart Clinic - Vascular Surgery  - Vascular Ultrasound  5th Floor: - HeartCare Cardiology (general and EP) - Clinical Pharmacy for coumadin, hypertension, lipid, weight-loss medications, and med management appointments    Valet parking services will be available as well.

## 2023-11-11 ENCOUNTER — Other Ambulatory Visit (HOSPITAL_COMMUNITY): Payer: Self-pay

## 2023-11-20 ENCOUNTER — Encounter: Payer: Commercial Managed Care - PPO | Admitting: Physician Assistant

## 2023-11-25 ENCOUNTER — Ambulatory Visit: Admitting: Sports Medicine

## 2023-11-25 DIAGNOSIS — M1612 Unilateral primary osteoarthritis, left hip: Secondary | ICD-10-CM | POA: Diagnosis not present

## 2023-11-27 ENCOUNTER — Other Ambulatory Visit: Payer: Self-pay | Admitting: Student

## 2023-11-27 DIAGNOSIS — M1612 Unilateral primary osteoarthritis, left hip: Secondary | ICD-10-CM

## 2023-12-04 ENCOUNTER — Other Ambulatory Visit (HOSPITAL_COMMUNITY): Payer: Self-pay

## 2023-12-09 ENCOUNTER — Ambulatory Visit
Admission: RE | Admit: 2023-12-09 | Discharge: 2023-12-09 | Disposition: A | Discharge status: Home / Self Care | Source: Ambulatory Visit | Attending: Student | Admitting: Student

## 2023-12-09 DIAGNOSIS — M25552 Pain in left hip: Secondary | ICD-10-CM | POA: Diagnosis not present

## 2023-12-09 DIAGNOSIS — M1612 Unilateral primary osteoarthritis, left hip: Secondary | ICD-10-CM

## 2023-12-09 MED ORDER — IOPAMIDOL (ISOVUE-M 200) INJECTION 41%
1.0000 mL | Freq: Once | INTRAMUSCULAR | Status: AC
  Administered 2023-12-09: 1 mL via INTRA_ARTICULAR

## 2023-12-09 MED ORDER — METHYLPREDNISOLONE ACETATE 40 MG/ML INJ SUSP (RADIOLOG
80.0000 mg | Freq: Once | INTRAMUSCULAR | Status: AC
Start: 2023-12-09 — End: 2023-12-09
  Administered 2023-12-09: 80 mg via INTRA_ARTICULAR

## 2024-01-13 ENCOUNTER — Encounter: Payer: Self-pay | Admitting: Physician Assistant

## 2024-01-13 ENCOUNTER — Other Ambulatory Visit: Payer: Self-pay | Admitting: Physician Assistant

## 2024-01-13 ENCOUNTER — Other Ambulatory Visit (HOSPITAL_COMMUNITY): Payer: Self-pay

## 2024-01-13 ENCOUNTER — Encounter: Payer: Self-pay | Admitting: Pharmacist

## 2024-01-13 ENCOUNTER — Other Ambulatory Visit: Payer: Self-pay

## 2024-01-13 DIAGNOSIS — F419 Anxiety disorder, unspecified: Secondary | ICD-10-CM

## 2024-01-13 MED ORDER — BUPROPION HCL ER (XL) 300 MG PO TB24
300.0000 mg | ORAL_TABLET | Freq: Every day | ORAL | 1 refills | Status: DC
  Filled 2024-01-13: qty 90, 90d supply, fill #0
  Filled 2024-04-22: qty 90, 90d supply, fill #1

## 2024-01-13 MED ORDER — BUPROPION HCL ER (XL) 300 MG PO TB24: 300.0000 mg | ORAL_TABLET | Freq: Every day | ORAL | 1 refills | Status: DC

## 2024-01-14 ENCOUNTER — Other Ambulatory Visit: Payer: Self-pay

## 2024-01-14 ENCOUNTER — Encounter (HOSPITAL_BASED_OUTPATIENT_CLINIC_OR_DEPARTMENT_OTHER): Payer: Self-pay | Admitting: Physical Therapy

## 2024-01-14 ENCOUNTER — Ambulatory Visit (HOSPITAL_BASED_OUTPATIENT_CLINIC_OR_DEPARTMENT_OTHER): Attending: Student | Admitting: Physical Therapy

## 2024-01-14 ENCOUNTER — Other Ambulatory Visit (HOSPITAL_COMMUNITY): Payer: Self-pay

## 2024-01-14 DIAGNOSIS — M6281 Muscle weakness (generalized): Secondary | ICD-10-CM | POA: Diagnosis not present

## 2024-01-14 DIAGNOSIS — M25652 Stiffness of left hip, not elsewhere classified: Secondary | ICD-10-CM | POA: Insufficient documentation

## 2024-01-14 DIAGNOSIS — M25552 Pain in left hip: Secondary | ICD-10-CM | POA: Insufficient documentation

## 2024-01-14 NOTE — Therapy (Signed)
 OUTPATIENT PHYSICAL THERAPY LOWER EXTREMITY EVALUATION   Patient Name: Cristian Odom MRN: [id number] DOB:[birth year], 48 y.o., male Today's Date: 01/14/2024  END OF SESSION:  PT End of Session - 01/14/24 1240     Visit Number 1    Number of Visits 21    Date for PT Re-Evaluation 04/13/24    Authorization Type [employer]    PT Start Time 0805    PT Stop Time 0845    PT Time Calculation (min) 40 min    Activity Tolerance Patient tolerated treatment well    Behavior During Therapy WFL for tasks assessed/performed             Past Medical History:  Diagnosis Date   Allergy    OA (osteoarthritis) of knee, right    Past Surgical History:  Procedure Laterality Date   ANTERIOR CRUCIATE LIGAMENT REPAIR Right    Patient Active Problem List   Diagnosis Date Noted   Chronic pain of right knee 07/21/2018   Anxiety 11/10/2013   AR (allergic rhinitis) 11/10/2013    PCP:    Alexander Iba, PA    REFERRING PROVIDER:    Laneta Pintos, MD    REFERRING DIAG:  Laneta Pintos, MD     THERAPY DIAG:  Pain in left hip  Muscle weakness (generalized)  Stiffness of left hip, not elsewhere classified  Rationale for Evaluation and Treatment: Rehabilitation  ONSET DATE: 1 year  SUBJECTIVE:   SUBJECTIVE STATEMENT:  Pt notes that the hip is painful and limited that feels like a disconnection and feeling like he has to drag the hip. Pt feels a catch in the front of the hip and that feels like it's disconnected. Feels like it is loose and unstable. Unable to perform lateral movements. Feels like it gives way. Pt feels like he might have sciatica symptoms as well. Denies specific MOI. Plays tennis, golf, running, and workouts out in the gym. Denies NT. Denies sudden onset of leg weakness. Did get a CSI that did not make a large difference. Denies LBP. Denies cancer red flags. Denies GI or kidney related pain.   Pt works out about 3-5x/week.   PERTINENT HISTORY: R ACL   PAIN:  Are you having pain? Yes: NPRS scale: none at rest, 8/10 at worst  Pain location: L anterior and lateral hip, C shaped  Pain description: ache, sharp, catch Aggravating factors: end of the day Relieving factors: sitting, ibuprofen, Meloxicam   PRECAUTIONS: None  RED FLAGS: None   WEIGHT BEARING RESTRICTIONS: No  FALLS:  Has patient fallen in last 6 months? 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  DIAGNOSTIC FINDINGS: n/a in chart   Mild OA and CAM lesion on X-ray  PATIENT SURVEYS:   Extreme difficulty/unable (0), Quite a bit of difficulty (1), Moderate difficulty (2), Little difficulty (3), No difficulty (4)  Survey date:  eval  Any of your usual work, housework or school activities 4  2. Usual hobbies, recreational or sporting activities 2  3. Getting into/out of the bath 4  4. Walking between rooms 4  5. Putting on socks/shoes 3  6. Squatting  2  7. Lifting an object, like a bag of groceries  from the floor 4  8. Performing light activities around your home 4  9. Performing heavy activities around your home 3  10. Getting into/out of a car 3  11. Walking 2 blocks 4  12. Walking 1 mile 4  13. Going up/down 10 stairs (1 flight) 4  14. Standing for 1 hour 4  15.  sitting for 1 hour 4  16. Running on even ground 2  17. Running on uneven ground 2  18. Making sharp turns while running fast 2  19. Hopping  2  20. Rolling over in bed 4  Score total:  65      COGNITION: Overall cognitive status: Within functional limits for tasks assessed                         PALPATION: TTP of L glutes and hypertonicity of lumbar paraspinals  and QL   LOWER EXTREMITY ROM:   PROM L eval R eval  Hip flexion 100 p! 120  Hip extension -5 10  Hip abduction 35 40  Hip adduction      Hip internal rotation 15 p! 35  Hip external rotation 20 p! 45  Knee flexion      Knee extension      Ankle dorsiflexion      Ankle plantarflexion      Ankle inversion      Ankle eversion       (Blank rows = not tested)  MMT  HHD in lbs Right eval Left eval  Hip flexion 91.2 63.6  Hip extension 64.0 54.1  Hip abduction 79.0 59.8  Hip adduction    Hip internal rotation    Hip external rotation    Knee flexion    Knee extension    Ankle dorsiflexion    Ankle plantarflexion    Ankle inversion    Ankle eversion     (Blank rows = not tested)     GAIT:  Level of assistance: Independent Comments: WFL  Stairs: 1x flight - increased lumbar SB with ascend, lack of L hip rotation and extension on descent                                                                                                                                TREATMENT DATE:      Exercises - Seated Quadratus Lumborum Stretch in Chair  - 2 x daily - 7 x weekly - 1 sets - 3 reps - 30 hold - Supine Hip Internal and External Rotation  - 2 x daily - 7 x weekly - 2 sets - 10 reps - Figure 4 Bridge  - 2 x daily - 7 x weekly - 2 sets - 10 reps  Acceptable pain, self pain management, conservative vs surgical management, biomechanics of L hip and lumbar     PATIENT EDUCATION:  Education details: MOI, diagnosis, prognosis, anatomy, exercise progression, DOMS expectations,  muscle firing,  envelope of function, HEP, POC  Person educated: Patient Education method: Explanation, Demonstration, Tactile cues, and Verbal cues Education comprehension: verbalized understanding, returned demonstration, verbal cues required, tactile cues required, and needs further education     HOME EXERCISE PROGRAM:     Access Code: [email or web address] URL: [email or web address] Date:  01/14/2024 Prepared by: Silver Dross     ASSESSMENT:   CLINICAL IMPRESSION:  Patient is a 48 y.o. male who was seen today for physical therapy evaluation and treatment for L hip pain. Pt's s/s appear consistent with potential L hip internal derangement. Pt with expected gait, ROM, and strength deficits on L hip with lumbar compensations. Pt does also have signs of radicular pain which is likely due to associated L lumbar tightness. Pt is stiffness limited at this time with moderately sensitive pain. Plan to proceed with ROM focus and progressive strength as tolerated. Consider belt mobilizations for hip and/or lumbar mobilizations at next for pain relief and joint mobility. Pt would benefit from continued skilled therapy in order to reach goals and maximize functional L LE strength and ROM for full return to PLOF.      OBJECTIVE IMPAIRMENTS: decreased activity tolerance, decreased endurance, decreased mobility, difficulty walking, decreased strength, and pain.    ACTIVITY LIMITATIONS: standing, stairs, and locomotion level   PARTICIPATION LIMITATIONS: cleaning, shopping, and community activity   PERSONAL FACTORS: Fitness, 2+ comorbidities, and Time since onset of injury/illness/exacerbation are also affecting patient's functional outcome.    REHAB POTENTIAL: Good   CLINICAL DECISION MAKING: stable/uncomplicated   EVALUATION COMPLEXITY: low     GOALS:     SHORT TERM GOALS: Target date: 02/02/2024      Pt will be independent and compliant with HEP for improved pain, strength, and function.  Baseline: Goal status: INITIAL   2.  Pt will be able to demonstrate symmetrical PROM of the L hip  in order to demonstrate functional improvement in LE function for progression more normalized gait and community mobility.    Baseline:  Goal status: INITIAL    3. Pt will report at least 2 pt reduction on NPRS scale for pain in order to demonstrate functional improvement with household activity,  self care, and ADL.      Baseline:  Goal status: INITIAL      LONG TERM GOALS: Target date: 04/13/2024   Pt  will become independent with final HEP in order to demonstrate synthesis of PT education.  Baseline:  Goal status: INITIAL  2.  Pt will be able to demonstrate full depth squat without pain in order to demonstrate functional improvement in LE function for self-care and house hold duties.  Baseline:  Goal status: INITIAL  3.  Pt will be able to lift/squat/hold >45 lbs in order to demonstrate functional improvement in lumbopelvic strength for return to exercise.  Baseline:  Goal status: INITIAL  4.  Pt will be able to demonstrate full AROM of the L hip without pain in order to demonstrate functional improvement in LE function for progression towards advanced SL strength and motor control exercise.  Baseline:  Goal status: INITIAL  5.  Pt will have an at least 9 pt improvement in LEFS measure in order to demonstrate MCID improvement in daily function.  Baseline:  Goal status: INITIAL    PLAN:  PT FREQUENCY: 1-2x/week  PT DURATION: 12 weeks  PLANNED INTERVENTIONS: 97164- PT Re-evaluation, 97110-Therapeutic exercises, 97530- Therapeutic activity, [id number]- Neuromuscular re-education, 97535- Self Care, [id number]- Manual therapy, [id number]- Gait training, [id number]-  Orthotic Initial, 78295- Aquatic Therapy, [phone]- Electrical stimulation (unattended), [id number]- Electrical stimulation (manual), [id number]- Vasopneumatic device, [id number]- Ultrasound, [id number]- Ionotophoresis 4mg /ml Dexamethasone, Patient/Family education, Balance training, Stair training, Taping, Joint mobilization, Joint manipulation, Spinal manipulation, Spinal mobilization, Cryotherapy, and Moist heat  PLAN FOR NEXT SESSION: L hip mobility, loaded stretching, lumbar mobility, nerve flossing   Silver Dross, PT 01/14/2024, 12:47 PM

## 2024-01-26 ENCOUNTER — Other Ambulatory Visit (HOSPITAL_COMMUNITY): Payer: Self-pay

## 2024-01-27 ENCOUNTER — Ambulatory Visit (HOSPITAL_BASED_OUTPATIENT_CLINIC_OR_DEPARTMENT_OTHER): Admitting: Physical Therapy

## 2024-01-27 ENCOUNTER — Encounter (HOSPITAL_BASED_OUTPATIENT_CLINIC_OR_DEPARTMENT_OTHER): Payer: Self-pay | Admitting: Physical Therapy

## 2024-01-27 DIAGNOSIS — M6281 Muscle weakness (generalized): Secondary | ICD-10-CM | POA: Diagnosis not present

## 2024-01-27 DIAGNOSIS — M25552 Pain in left hip: Secondary | ICD-10-CM

## 2024-01-27 DIAGNOSIS — M25652 Stiffness of left hip, not elsewhere classified: Secondary | ICD-10-CM

## 2024-01-27 NOTE — Therapy (Signed)
 OUTPATIENT PHYSICAL THERAPY TREATMENT   Patient Name: Cristian Odom MRN: [id number] DOB:04/08/1976, 48 y.o., male Today's Date: 01/27/2024  END OF SESSION:  PT End of Session - 01/27/24 0803     Visit Number 2    Number of Visits 21    Date for PT Re-Evaluation 04/13/24    Authorization Type [employer]    PT Start Time 0803    PT Stop Time 0843    PT Time Calculation (min) 40 min    Activity Tolerance Patient tolerated treatment well    Behavior During Therapy [facility] for tasks assessed/performed          Past Medical History:  Diagnosis Date   Allergy    OA (osteoarthritis) of knee, right    Past Surgical History:  Procedure Laterality Date   ANTERIOR CRUCIATE LIGAMENT REPAIR Right    Patient Active Problem List   Diagnosis Date Noted   Chronic pain of right knee 07/21/2018   Anxiety 11/10/2013   AR (allergic rhinitis) 11/10/2013    PCP:    Job Lukes, PA    REFERRING PROVIDER:    Kendal Franky SQUIBB, MD    REFERRING DIAG:  Kendal Franky SQUIBB, MD     THERAPY DIAG:  Pain in left hip  Muscle weakness (generalized)  Stiffness of left hip, not elsewhere classified  Rationale for Evaluation and Treatment: Rehabilitation  ONSET DATE: 1 year  SUBJECTIVE:   SUBJECTIVE STATEMENT:  Patient states hip is status quo. Did HEP a time or 2.   EVAL: Pt notes that the hip is painful and limited that feels like a disconnection and feeling like he has to drag the hip. Pt feels a catch in the front of the hip and that feels like it's disconnected. Feels like it is loose and unstable. Unable to perform lateral movements. Feels like it gives way. Pt feels like he might have sciatica symptoms as well. Denies specific MOI. Plays tennis, golf, running, and workouts out in the gym. Denies NT. Denies sudden onset of leg weakness. Did get a CSI that did not make a large difference. Denies LBP. Denies cancer red flags. Denies GI or kidney related pain.   Pt works out about  3-5x/week.   PERTINENT HISTORY: R ACL  PAIN:  Are you having pain? Yes: NPRS scale: none at rest, 8/10 at worst  Pain location: L anterior and lateral hip, C shaped  Pain description: ache, sharp, catch Aggravating factors: end of the day Relieving factors: sitting, ibuprofen, Meloxicam   PRECAUTIONS: None  RED FLAGS: None   WEIGHT BEARING RESTRICTIONS: No  FALLS:  Has patient fallen in last 6 months? 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  DIAGNOSTIC FINDINGS: n/a in chart   Mild OA and CAM lesion on X-ray  PATIENT SURVEYS:   Extreme difficulty/unable (0), Quite a bit of difficulty (1), Moderate difficulty (2), Little difficulty (3), No difficulty (4)  Survey date:  eval  Any of your usual work, housework or school activities 4  2. Usual hobbies, recreational or sporting activities 2  3. Getting into/out of the bath 4  4. Walking between rooms 4  5. Putting on socks/shoes 3  6. Squatting  2  7. Lifting an object, like a bag of groceries from the floor 4  8. Performing light activities around your home 4  9. Performing heavy activities around your home 3  10. Getting into/out of a car 3  11. Walking 2 blocks 4  12. Walking 1 mile 4  13. Going up/down 10 stairs (1 flight) 4  14. Standing for 1 hour 4  15.  sitting for 1 hour 4  16. Running on even ground 2  17. Running on uneven ground 2  18. Making sharp turns while running fast 2  19. Hopping  2  20. Rolling over in bed 4  Score total:  65      COGNITION: Overall cognitive status: Within functional limits for tasks assessed                         PALPATION: TTP of L  glutes and hypertonicity of lumbar paraspinals and QL   LOWER EXTREMITY ROM:   PROM L eval R eval  Hip flexion 100 p! 120  Hip extension -5 10  Hip abduction 35 40  Hip adduction      Hip internal rotation 15 p! 35  Hip external rotation 20 p! 45  Knee flexion      Knee extension      Ankle dorsiflexion      Ankle plantarflexion      Ankle inversion      Ankle eversion       (Blank rows = not tested)  MMT  HHD in lbs Right eval Left eval  Hip flexion 91.2 63.6  Hip extension 64.0 54.1  Hip abduction 79.0 59.8  Hip adduction    Hip internal rotation    Hip external rotation    Knee flexion    Knee extension    Ankle dorsiflexion    Ankle plantarflexion    Ankle inversion    Ankle eversion     (Blank rows = not tested)     GAIT:  Level of assistance: Independent Comments: WFL  Stairs: 1x flight - increased lumbar SB with ascend, lack of L hip rotation and extension on descent                                                                                                                                TREATMENT DATE:   01/27/24  Manual: grade III lateral belt glide at hip, prone ER/IR Grade II-III AP glide, prone Grade III PA glide in progressive extension Squat test-retest throughout manual Prone hip extension 2 x 10 Figure 4 bridge 2 x 10 1/2 kneeling hip flexor stretch with glute/core activation 3 x 20 second holds  01/14/24 Exercises - Seated Quadratus Lumborum Stretch in Chair  - 2 x daily - 7 x weekly - 1 sets - 3 reps - 30 hold - Supine Hip Internal and External Rotation  - 2 x daily -  7 x weekly - 2 sets - 10 reps - Figure 4 Bridge  - 2 x daily - 7 x weekly - 2 sets - 10 reps  Acceptable pain, self pain management, conservative vs surgical management, biomechanics of L hip and lumbar     PATIENT EDUCATION:  Education details: MOI, diagnosis, prognosis, anatomy, exercise progression, DOMS expectations, muscle firing,  envelope of function, HEP,  POC  Person educated: Patient Education method: Explanation, Demonstration, Tactile cues, and Verbal cues Education comprehension: verbalized understanding, returned demonstration, verbal cues required, tactile cues required, and needs further education     HOME EXERCISE PROGRAM:     Access Code: [email or web address] URL: [email or web address] Date: 01/14/2024 Prepared by: Dale Call     ASSESSMENT:   CLINICAL IMPRESSION:  Began session with manual for hip mobility and symptoms with greatest benefit from ER/IR mobilizations. Test retest throughout with squat. Initially, slight weight shift off LLE which improves with reps/manual. Continued with hip mobility and glute strengthening which is tolerated well. Patient will continue to benefit from physical therapy in order to improve function and reduce impairment.   EVAL: Patient is a 48 y.o. male who was seen today for physical therapy evaluation and treatment for L hip pain. Pt's s/s appear consistent with potential L hip internal derangement. Pt with expected gait, ROM, and strength deficits on L hip with lumbar compensations. Pt does also have signs of radicular pain which is likely due to associated L lumbar tightness. Pt is stiffness limited at this time with moderately sensitive pain. Plan to proceed with ROM focus and progressive strength as tolerated. Consider belt mobilizations for hip and/or lumbar mobilizations at next for pain relief and joint mobility. Pt would benefit from continued skilled therapy in order to reach goals and maximize functional L LE strength and ROM for full return to PLOF.      OBJECTIVE IMPAIRMENTS: decreased activity tolerance, decreased endurance, decreased mobility, difficulty walking, decreased strength, and pain.    ACTIVITY LIMITATIONS: standing, stairs, and locomotion level   PARTICIPATION LIMITATIONS: cleaning, shopping, and community activity   PERSONAL FACTORS: Fitness, 2+ comorbidities, and  Time since onset of injury/illness/exacerbation are also affecting patient's functional outcome.    REHAB POTENTIAL: Good   CLINICAL DECISION MAKING: stable/uncomplicated   EVALUATION COMPLEXITY: low     GOALS:     SHORT TERM GOALS: Target date: 02/02/2024      Pt will be independent and compliant with HEP for improved pain, strength, and function.  Baseline: Goal status: INITIAL   2.  Pt will be able to demonstrate symmetrical PROM of the L hip  in order to demonstrate functional improvement in LE function for progression more normalized gait and community mobility.    Baseline:  Goal status: INITIAL    3. Pt will report at least 2 pt reduction on NPRS scale for pain in order to demonstrate functional improvement with household activity, self care, and ADL.      Baseline:  Goal status: INITIAL      LONG TERM GOALS: Target date: 04/13/2024   Pt  will become independent with final HEP in order to demonstrate synthesis of PT education.  Baseline:  Goal status: INITIAL  2.  Pt will be able to demonstrate full depth squat without pain in order to demonstrate functional improvement in LE function for self-care and house hold duties.  Baseline:  Goal status: INITIAL  3.  Pt will be able to lift/squat/hold >45 lbs in order to demonstrate functional improvement in  lumbopelvic strength for return to exercise.  Baseline:  Goal status: INITIAL  4.  Pt will be able to demonstrate full AROM of the L hip without pain in order to demonstrate functional improvement in LE function for progression towards advanced SL strength and motor control exercise.  Baseline:  Goal status: INITIAL  5.  Pt will have an at least 9 pt improvement in LEFS measure in order to demonstrate MCID improvement in daily function.  Baseline:  Goal status: INITIAL    PLAN:  PT FREQUENCY: 1-2x/week  PT DURATION: 12 weeks  PLANNED INTERVENTIONS: 97164- PT Re-evaluation, 97110-Therapeutic exercises,  97530- Therapeutic activity, 97112- Neuromuscular re-education, 97535- Self Care, 02859- Manual therapy, [phone]- Gait training, [phone]- Orthotic Initial, [phone]- Aquatic Therapy, [phone]- Electrical stimulation (unattended), [phone]- Electrical stimulation (manual), 97016- Vasopneumatic device, [id number]- Ultrasound, [id number]- Ionotophoresis 4mg /ml Dexamethasone, Patient/Family education, Balance training, Stair training, Taping, Joint mobilization, Joint manipulation, Spinal manipulation, Spinal mobilization, Cryotherapy, and Moist heat  PLAN FOR NEXT SESSION: L hip mobility, loaded stretching, lumbar mobility, nerve flossing   Prentice GORMAN Stains, PT, DPT 01/27/2024, 8:47 AM

## 2024-02-11 ENCOUNTER — Ambulatory Visit (HOSPITAL_BASED_OUTPATIENT_CLINIC_OR_DEPARTMENT_OTHER): Attending: Student | Admitting: Physical Therapy

## 2024-02-11 ENCOUNTER — Encounter (HOSPITAL_BASED_OUTPATIENT_CLINIC_OR_DEPARTMENT_OTHER): Payer: Self-pay | Admitting: Physical Therapy

## 2024-02-11 DIAGNOSIS — M25552 Pain in left hip: Secondary | ICD-10-CM | POA: Diagnosis not present

## 2024-02-11 DIAGNOSIS — M25652 Stiffness of left hip, not elsewhere classified: Secondary | ICD-10-CM | POA: Insufficient documentation

## 2024-02-11 DIAGNOSIS — M6281 Muscle weakness (generalized): Secondary | ICD-10-CM | POA: Insufficient documentation

## 2024-02-11 NOTE — Therapy (Unsigned)
 OUTPATIENT PHYSICAL THERAPY TREATMENT   Patient Name: Cristian Odom MRN: [id number] DOB:Jun 08, 1976, 48 y.o., male Today's Date: 02/11/2024  END OF SESSION:  PT End of Session - 02/11/24 0720     Visit Number 3    Number of Visits 21    Date for PT Re-Evaluation 04/13/24    Authorization Type [employer]    PT Start Time 0715    PT Stop Time 0758    PT Time Calculation (min) 43 min    Activity Tolerance Patient tolerated treatment well    Behavior During Therapy WFL for tasks assessed/performed          Past Medical History:  Diagnosis Date   Allergy    OA (osteoarthritis) of knee, right    Past Surgical History:  Procedure Laterality Date   ANTERIOR CRUCIATE LIGAMENT REPAIR Right    Patient Active Problem List   Diagnosis Date Noted   Chronic pain of right knee 07/21/2018   Anxiety 11/10/2013   AR (allergic rhinitis) 11/10/2013    PCP:    Job Lukes, PA    REFERRING PROVIDER:    Kendal Franky SQUIBB, MD    REFERRING DIAG:  Kendal Franky SQUIBB, MD     THERAPY DIAG:  Pain in left hip  Muscle weakness (generalized)  Stiffness of left hip, not elsewhere classified  Rationale for Evaluation and Treatment: Rehabilitation  ONSET DATE: 1 year  SUBJECTIVE:   SUBJECTIVE STATEMENT:  Patient states hip is status quo. Did HEP a time or 2.   EVAL: Pt notes that the hip is painful and limited that feels like a disconnection and feeling like he has to drag the hip. Pt feels a catch in the front of the hip and that feels like it's disconnected. Feels like it is loose and unstable. Unable to perform lateral movements. Feels like it gives way. Pt feels like he might have sciatica symptoms as well. Denies specific MOI. Plays tennis, golf, running, and workouts out in the gym. Denies NT. Denies sudden onset of leg weakness. Did get a CSI that did not make a large difference. Denies LBP. Denies cancer red flags. Denies GI or kidney related pain.   Pt works out about  3-5x/week.   PERTINENT HISTORY: R ACL  PAIN:  Are you having pain? Yes: NPRS scale: none at rest, 8/10 at worst  Pain location: L anterior and lateral hip, C shaped  Pain description: ache, sharp, catch Aggravating factors: end of the day Relieving factors: sitting, ibuprofen, Meloxicam   PRECAUTIONS: None  RED FLAGS: None   WEIGHT BEARING RESTRICTIONS: No  FALLS:  Has patient fallen in last 6 months? 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  DIAGNOSTIC FINDINGS: n/a in chart   Mild OA and CAM lesion on X-ray  PATIENT SURVEYS:   Extreme difficulty/unable (0), Quite a bit of difficulty (1), Moderate difficulty (2), Little difficulty (3), No difficulty (4)  Survey date:  eval  Any of your usual work, housework or school activities 4  2. Usual hobbies, recreational or sporting activities 2  3. Getting into/out of the bath 4  4. Walking between rooms 4  5. Putting on socks/shoes 3  6. Squatting  2  7. Lifting an object, like a bag of groceries from the floor 4  8. Performing light activities around your home 4  9. Performing heavy activities around your home 3  10. Getting into/out of a car 3  11. Walking 2 blocks 4  12. Walking 1 mile 4  13. Going up/down 10 stairs (1 flight) 4  14. Standing for 1 hour 4  15.  sitting for 1 hour 4  16. Running on even ground 2  17. Running on uneven ground 2  18. Making sharp turns while running fast 2  19. Hopping  2  20. Rolling over in bed 4  Score total:  65      COGNITION: Overall cognitive status: Within functional limits for tasks assessed                         PALPATION: TTP of L  glutes and hypertonicity of lumbar paraspinals and QL   LOWER EXTREMITY ROM:   PROM L eval R eval  Hip flexion 100 p! 120  Hip extension -5 10  Hip abduction 35 40  Hip adduction      Hip internal rotation 15 p! 35  Hip external rotation 20 p! 45  Knee flexion      Knee extension      Ankle dorsiflexion      Ankle plantarflexion      Ankle inversion      Ankle eversion       (Blank rows = not tested)  MMT  HHD in lbs Right eval Left eval  Hip flexion 91.2 63.6  Hip extension 64.0 54.1  Hip abduction 79.0 59.8  Hip adduction    Hip internal rotation    Hip external rotation    Knee flexion    Knee extension    Ankle dorsiflexion    Ankle plantarflexion    Ankle inversion    Ankle eversion     (Blank rows = not tested)     GAIT:  Level of assistance: Independent Comments: WFL  Stairs: 1x flight - increased lumbar SB with ascend, lack of L hip rotation and extension on descent                                                                                                                                TREATMENT DATE:   7/9 Manual: Inferior and posterior glide grade 2 and 3    There-ex:  Leg press 3x12  70 lbs RPE of 2  90 lbs RPE of 3  115 RPE of 4  Cybex   Neuro-re ed  Cable walk 3 x 25 lbs   01/27/24  Manual: grade III lateral belt glide at hip, prone ER/IR Grade II-III AP glide, prone Grade III PA glide in progressive extension Squat test-retest throughout manual Prone hip extension 2 x 10 Figure 4 bridge 2 x 10 1/2 kneeling hip flexor  stretch with glute/core activation 3 x 20 second holds  01/14/24 Exercises - Seated Quadratus Lumborum Stretch in Chair  - 2 x daily - 7 x weekly - 1 sets - 3 reps - 30 hold - Supine Hip Internal and External Rotation  - 2 x daily - 7 x weekly - 2 sets - 10 reps - Figure 4 Bridge  - 2 x daily - 7 x weekly - 2 sets - 10 reps  Acceptable pain, self pain management, conservative vs surgical management,  biomechanics of L hip and lumbar     PATIENT EDUCATION:  Education details: MOI, diagnosis, prognosis, anatomy, exercise progression, DOMS expectations, muscle firing,  envelope of function, HEP, POC  Person educated: Patient Education method: Explanation, Demonstration, Tactile cues, and Verbal cues Education comprehension: verbalized understanding, returned demonstration, verbal cues required, tactile cues required, and needs further education     HOME EXERCISE PROGRAM:     Access Code: [email or web address] URL: [email or web address] Date: 01/14/2024 Prepared by: Dale Call     ASSESSMENT:   CLINICAL IMPRESSION:  Began session with manual for hip mobility and symptoms with greatest benefit from ER/IR mobilizations. Test retest throughout with squat. Initially, slight weight shift off LLE which improves with reps/manual. Continued with hip mobility and glute strengthening which is tolerated well. Patient will continue to benefit from physical therapy in order to improve function and reduce impairment.   EVAL: Patient is a 48 y.o. male who was seen today for physical therapy evaluation and treatment for L hip pain. Pt's s/s appear consistent with potential L hip internal derangement. Pt with expected gait, ROM, and strength deficits on L hip with lumbar compensations. Pt does also have signs of radicular pain which is likely due to associated L lumbar tightness. Pt is stiffness limited at this time with moderately sensitive pain. Plan to proceed with ROM focus and progressive strength as tolerated. Consider belt mobilizations for hip and/or lumbar mobilizations at next for pain relief and joint mobility. Pt would benefit from continued skilled therapy in order to reach goals and maximize functional L LE strength and ROM for full return to PLOF.      OBJECTIVE IMPAIRMENTS: decreased activity tolerance, decreased endurance, decreased mobility, difficulty walking, decreased strength, and  pain.    ACTIVITY LIMITATIONS: standing, stairs, and locomotion level   PARTICIPATION LIMITATIONS: cleaning, shopping, and community activity   PERSONAL FACTORS: Fitness, 2+ comorbidities, and Time since onset of injury/illness/exacerbation are also affecting patient's functional outcome.    REHAB POTENTIAL: Good   CLINICAL DECISION MAKING: stable/uncomplicated   EVALUATION COMPLEXITY: low     GOALS:     SHORT TERM GOALS: Target date: 02/02/2024      Pt will be independent and compliant with HEP for improved pain, strength, and function.  Baseline: Goal status: INITIAL   2.  Pt will be able to demonstrate symmetrical PROM of the L hip  in order to demonstrate functional improvement in LE function for progression more normalized gait and community mobility.    Baseline:  Goal status: INITIAL    3. Pt will report at least 2 pt reduction on NPRS scale for pain in order to demonstrate functional improvement with household activity, self care, and ADL.      Baseline:  Goal status: INITIAL      LONG TERM GOALS: Target date: 04/13/2024   Pt  will become independent with final HEP in order to demonstrate synthesis of PT education.  Baseline:  Goal status: INITIAL  2.  Pt will be able to demonstrate full depth squat without pain in order to demonstrate functional improvement in LE function for self-care and house hold duties.  Baseline:  Goal status: INITIAL  3.  Pt will be able to lift/squat/hold >45 lbs in order to demonstrate functional improvement in lumbopelvic strength for return to exercise.  Baseline:  Goal status: INITIAL  4.  Pt will be able to demonstrate full AROM of the L hip without pain in order to demonstrate functional improvement in LE function for progression towards advanced SL strength and motor control exercise.  Baseline:  Goal status: INITIAL  5.  Pt will have an at least 9 pt improvement in LEFS measure in order to demonstrate MCID improvement  in daily function.  Baseline:  Goal status: INITIAL    PLAN:  PT FREQUENCY: 1-2x/week  PT DURATION: 12 weeks  PLANNED INTERVENTIONS: 97164- PT Re-evaluation, 97110-Therapeutic exercises, 97530- Therapeutic activity, 97112- Neuromuscular re-education, 97535- Self Care, 02859- Manual therapy, [phone]- Gait training, [phone]- Orthotic Initial, [phone]- Aquatic Therapy, [phone]- Electrical stimulation (unattended), [phone]- Electrical stimulation (manual), [id number]- Vasopneumatic device, [id number]- Ultrasound, [id number]- Ionotophoresis 4mg /ml Dexamethasone, Patient/Family education, Balance training, Stair training, Taping, Joint mobilization, Joint manipulation, Spinal manipulation, Spinal mobilization, Cryotherapy, and Moist heat  PLAN FOR NEXT SESSION: L hip mobility, loaded stretching, lumbar mobility, nerve flossing   Alm JINNY Don, PT, DPT 02/11/2024, 7:21 AM

## 2024-02-12 ENCOUNTER — Encounter (HOSPITAL_BASED_OUTPATIENT_CLINIC_OR_DEPARTMENT_OTHER): Payer: Self-pay | Admitting: Physical Therapy

## 2024-02-12 ENCOUNTER — Telehealth: Payer: Self-pay | Admitting: *Deleted

## 2024-02-12 NOTE — Telephone Encounter (Signed)
 Left detailed message on personal voicemail, told him not sure why lab appt was cancelled other than the labs that are ordered are by another provider looks like Cardiology. Unfortunately we can not do other providers labs here, you will need to contact Cardiology to have your labs drawn at their lab. Any questions please call office.

## 2024-02-12 NOTE — Telephone Encounter (Signed)
 Copied from CRM [id number]. Topic: General - Other >> Feb 12, 2024  8:20 AM Deleta RAMAN wrote: Reason for CRM: patient lab visit was canceled for 02/17/24 he would like to know why and if he can get the appointment rescheduled

## 2024-02-14 ENCOUNTER — Encounter (HOSPITAL_BASED_OUTPATIENT_CLINIC_OR_DEPARTMENT_OTHER): Admitting: Physical Therapy

## 2024-02-16 NOTE — Progress Notes (Signed)
 Subjective:    Cristian Odom is a 48 y.o. male and is here for a comprehensive physical exam.  HPI  Health Maintenance Due  Topic Date Due   Hepatitis B Vaccines (1 of 3 - 19+ 3-dose series) Never done   Zoster Vaccines- Shingrix (1 of 2) Never done    Acute Concerns: Nail Fungus  Patient continues to complains of nail fungus in his right toe. He does follow up with dermatology for this who had recommended continuing Terbinafine  HCl for 3 months as nail take a long time to grow out.    Chronic Issues: Anxiety He is compliant with 150 mg Wellbutrin  once daily and 0.25 mg Xanax  as needed.  Stable, no complains at this time.  HLD Condition is managed by Dr. Jacquetta. Patient reports compliance and good tolerance of Lipitor 10 mg daily.  He is requesting labs today.  Health Maintenance: Immunizations -- requesting shingles vaccine today but will delay due to insurance.  Colonoscopy -- last done on 01/09/21. 7 year recall.  PSA --  Lab Results  Component Value Date   PSA 0.72 11/19/2022   PSA 0.83 10/10/2020   Diet -- typically follows a healthy, well balanced diet Sleep habits -- no complains  Exercise -- walks and resistance training every other day.  Weight --  Recent weight history Wt Readings from Last 10 Encounters:  02/17/24 201 lb 12.8 oz (91.5 kg)  11/10/23 208 lb 12.8 oz (94.7 kg)  10/23/23 203 lb 4 oz (92.2 kg)  01/15/23 208 lb 8 oz (94.6 kg)  11/19/22 207 lb 4 oz (94 kg)  07/26/22 209 lb 12.8 oz (95.2 kg)  11/13/21 209 lb (94.8 kg)  01/09/21 214 lb (97.1 kg)  12/26/20 214 lb (97.1 kg)  10/10/20 207 lb 8 oz (94.1 kg)   Body mass index is 23.93 kg/m.  Mood -- stable Alcohol use --  reports current alcohol use of about 5.0 - 7.0 standard drinks of alcohol per week.  Tobacco use --  Tobacco Use: Low Risk  (02/12/2024)   Patient History    Smoking Tobacco Use: Never    Smokeless Tobacco Use: Never    Passive Exposure: Not on file    Eligible for Low  Dose CT? no  UTD with eye doctor? yes UTD with dentist? yes     02/17/2024    8:06 AM  Depression screen PHQ 2/9  Decreased Interest 0  Down, Depressed, Hopeless 0  PHQ - 2 Score 0    Other providers/specialists: Patient Care Team: Job Lukes, [place] as PCP - General (Physician Assistant) Delford Maude BROCKS, MD as PCP - Cardiology (Cardiology)    PMHx, SurgHx, SocialHx, Medications, and Allergies were reviewed in the Visit Navigator and updated as appropriate.   Past Medical History:  Diagnosis Date   Allergy    OA (osteoarthritis) of knee, right      Past Surgical History:  Procedure Laterality Date   ANTERIOR CRUCIATE LIGAMENT REPAIR Right      Family History  Problem Relation Age of Onset   Healthy Mother    Bladder Cancer Father    Colon polyps Father    Heart attack Father 43   Cancer Father        blood   Colon cancer Neg Hx    Esophageal cancer Neg Hx    Rectal cancer Neg Hx    Stomach cancer Neg Hx     Social History   Tobacco Use   Smoking status:  Never   Smokeless tobacco: Never  Vaping Use   Vaping status: Never Used  Substance Use Topics   Alcohol use: Yes    Alcohol/week: 5.0 - 7.0 standard drinks of alcohol    Types: 5 - 7 Standard drinks or equivalent per week   Drug use: No    Review of Systems:   Review of Systems  Constitutional:  Negative for chills, fever, malaise/fatigue and weight loss.  HENT:  Negative for hearing loss, sinus pain and sore throat.   Respiratory:  Negative for cough and hemoptysis.   Cardiovascular:  Negative for chest pain, palpitations, leg swelling and PND.  Gastrointestinal:  Negative for abdominal pain, constipation, diarrhea, heartburn, nausea and vomiting.  Genitourinary:  Negative for dysuria, frequency and urgency.  Musculoskeletal:  Negative for back pain, myalgias and neck pain.  Skin:  Negative for itching and rash.  Neurological:  Negative for dizziness, tingling, seizures and headaches.   Endo/Heme/Allergies:  Negative for polydipsia.  Psychiatric/Behavioral:  Negative for depression. The patient is not nervous/anxious.     Objective:    Vitals:   02/17/24 0810  BP: 120/70  Pulse: (!) 58  Temp: 97.9 F (36.6 C)  SpO2: 96%    Body mass index is 23.93 kg/m.  General  Alert, cooperative, no distress, appears stated age  Head:  Normocephalic, without obvious abnormality, atraumatic  Eyes:  PERRL, conjunctiva/corneas clear, EOM's intact, fundi benign, both eyes       Ears:  Normal TM's and external ear canals, both ears  Nose: Nares normal, septum midline, mucosa normal, no drainage or sinus tenderness  Throat: Lips, mucosa, and tongue normal; teeth and gums normal  Neck: Supple, symmetrical, trachea midline, no adenopathy;     thyroid:  No enlargement/tenderness/nodules; no carotid bruit or JVD  Back:   Symmetric, no curvature, ROM normal, no CVA tenderness  Lungs:   Clear to auscultation bilaterally, respirations unlabored  Chest wall:  No tenderness or deformity  Heart:  Regular rate and rhythm, S1 and S2 normal, no murmur, rub or gallop  Abdomen:   Soft, non-tender, bowel sounds active all four quadrants, no masses, no organomegaly  Extremities: Extremities normal, atraumatic, no cyanosis or edema  Prostate : Deferred   Skin: Skin color, texture, turgor normal, no rashes or lesions Distal right great toenail with thickened hyperpigmented appearach  Lymph nodes: Cervical, supraclavicular, and axillary nodes normal  Neurologic: CNII-XII grossly intact. Normal strength, sensation and reflexes throughout   AssessmentPlan:   Routine physical examination Today patient counseled on age appropriate routine health concerns for screening and prevention, each reviewed and up to date or declined. Immunizations reviewed and up to date or declined. Labs ordered and reviewed. Risk factors for depression reviewed and negative. Hearing function and visual acuity are intact.  ADLs screened and addressed as needed. Functional ability and level of safety reviewed and appropriate. Education, counseling and referrals performed based on assessed risks today. Patient provided with a copy of personalized plan for preventive services.  Anxiety and depression Overall stable Continue Wellbutrin  300 mg xl daily Continue xanax  0.25 mg twice daily as needed Follow up in 1 year, sooner if concerns  Elevated coronary artery calcium  score Management per cardiology Will order LPA per patient's request and orders from cardiology  Toenail fungus Provided OTC (available over the counter without a prescription) handout Consider podiatry referral -- he will let us  know   Prostate cancer screening Update PSA    Lucie Buttner, PA-C [facility]  I,Safa M Kadhim,acting as a scribe for [employer], PA.,have documented all relevant documentation on the behalf of Lucie Buttner, PA,as directed by  Lucie Buttner, PA while in the presence of Lucie Buttner, [place].   I, Lucie Buttner, [place], have reviewed all documentation for this visit. The documentation on 02/17/24 for the exam, diagnosis, procedures, and orders are all accurate and complete.

## 2024-02-17 ENCOUNTER — Encounter: Payer: Self-pay | Admitting: Physician Assistant

## 2024-02-17 ENCOUNTER — Ambulatory Visit (INDEPENDENT_AMBULATORY_CARE_PROVIDER_SITE_OTHER): Admitting: Physician Assistant

## 2024-02-17 ENCOUNTER — Other Ambulatory Visit

## 2024-02-17 VITALS — BP 120/70 | HR 58 | Temp 97.9°F | Ht 77.0 in | Wt 201.8 lb

## 2024-02-17 DIAGNOSIS — Z125 Encounter for screening for malignant neoplasm of prostate: Secondary | ICD-10-CM | POA: Diagnosis not present

## 2024-02-17 DIAGNOSIS — F32A Depression, unspecified: Secondary | ICD-10-CM

## 2024-02-17 DIAGNOSIS — F419 Anxiety disorder, unspecified: Secondary | ICD-10-CM

## 2024-02-17 DIAGNOSIS — R931 Abnormal findings on diagnostic imaging of heart and coronary circulation: Secondary | ICD-10-CM | POA: Diagnosis not present

## 2024-02-17 DIAGNOSIS — B351 Tinea unguium: Secondary | ICD-10-CM

## 2024-02-17 DIAGNOSIS — Z0000 Encounter for general adult medical examination without abnormal findings: Secondary | ICD-10-CM

## 2024-02-17 LAB — CBC WITH DIFFERENTIAL/PLATELET
Basophils Absolute: 0 K/uL (ref 0.0–0.1)
Basophils Relative: 0.5 % (ref 0.0–3.0)
Eosinophils Absolute: 0.2 K/uL (ref 0.0–0.7)
Eosinophils Relative: 6.5 % — ABNORMAL HIGH (ref 0.0–5.0)
HCT: 41.6 % (ref 39.0–52.0)
Hemoglobin: 14 g/dL (ref 13.0–17.0)
Lymphocytes Relative: 27.6 % (ref 12.0–46.0)
Lymphs Abs: 0.9 K/uL (ref 0.7–4.0)
MCHC: 33.5 g/dL (ref 30.0–36.0)
MCV: 89.8 fl (ref 78.0–100.0)
Monocytes Absolute: 0.4 K/uL (ref 0.1–1.0)
Monocytes Relative: 11.4 % (ref 3.0–12.0)
Neutro Abs: 1.8 K/uL (ref 1.4–7.7)
Neutrophils Relative %: 54 % (ref 43.0–77.0)
Platelets: 158 K/uL (ref 150.0–400.0)
RBC: 4.64 Mil/uL (ref 4.22–5.81)
RDW: 13.6 % (ref 11.5–15.5)
WBC: 3.4 K/uL — ABNORMAL LOW (ref 4.0–10.5)

## 2024-02-17 LAB — HEPATIC FUNCTION PANEL
ALT: 21 U/L (ref 0–53)
AST: 26 U/L (ref 0–37)
Albumin: 4.3 g/dL (ref 3.5–5.2)
Alkaline Phosphatase: 49 U/L (ref 39–117)
Bilirubin, Direct: 0.1 mg/dL (ref 0.0–0.3)
Total Bilirubin: 0.6 mg/dL (ref 0.2–1.2)
Total Protein: 6.4 g/dL (ref 6.0–8.3)

## 2024-02-17 LAB — BASIC METABOLIC PANEL WITH GFR
BUN: 16 mg/dL (ref 6–23)
CO2: 30 meq/L (ref 19–32)
Calcium: 9.1 mg/dL (ref 8.4–10.5)
Chloride: 104 meq/L (ref 96–112)
Creatinine, Ser: 1.09 mg/dL (ref 0.40–1.50)
GFR: 79.26 mL/min (ref >60.00)
Glucose, Bld: 88 mg/dL (ref 70–99)
Potassium: 3.9 meq/L (ref 3.5–5.1)
Sodium: 140 meq/L (ref 135–145)

## 2024-02-17 LAB — LIPID PANEL
Cholesterol: 143 mg/dL (ref 0–200)
HDL: 50.8 mg/dL (ref >39.00)
LDL Cholesterol: 83 mg/dL (ref 0–99)
NonHDL: 91.73
Total CHOL/HDL Ratio: 3
Triglycerides: 45 mg/dL (ref 0.0–149.0)
VLDL: 9 mg/dL (ref 0.0–40.0)

## 2024-02-17 LAB — PSA: PSA: 0.67 ng/mL (ref 0.10–4.00)

## 2024-02-17 NOTE — Patient Instructions (Addendum)
 It was great to see you!  You are eligible for the pneumonia and shingles vaccines -- please call and confirm with insurance they will cover this and either proceed with scheduling here or getting at pharmacy of your choice. Let me know if you would like podiatry referral.  Please go to the lab for blood work.   Our office will call you with your results unless you have chosen to receive results via MyChart.  If your blood work is normal we will follow-up each year for physicals and as scheduled for chronic medical problems.  If anything is abnormal we will treat accordingly and get you in for a follow-up.  Take care,  Keilin Gamboa

## 2024-02-18 ENCOUNTER — Ambulatory Visit (HOSPITAL_BASED_OUTPATIENT_CLINIC_OR_DEPARTMENT_OTHER): Admitting: Physical Therapy

## 2024-02-18 ENCOUNTER — Encounter (HOSPITAL_BASED_OUTPATIENT_CLINIC_OR_DEPARTMENT_OTHER): Payer: Self-pay | Admitting: Physical Therapy

## 2024-02-18 ENCOUNTER — Ambulatory Visit: Payer: Self-pay | Admitting: Physician Assistant

## 2024-02-18 DIAGNOSIS — M6281 Muscle weakness (generalized): Secondary | ICD-10-CM

## 2024-02-18 DIAGNOSIS — M25552 Pain in left hip: Secondary | ICD-10-CM | POA: Diagnosis not present

## 2024-02-18 DIAGNOSIS — M25652 Stiffness of left hip, not elsewhere classified: Secondary | ICD-10-CM

## 2024-02-18 NOTE — Therapy (Signed)
 OUTPATIENT PHYSICAL THERAPY TREATMENT   Patient Name: Cristian Odom MRN: [id number] DOB:Jul 29, 1976, 48 y.o., male Today's Date: 02/18/2024  END OF SESSION:  PT End of Session - 02/18/24 0734     Visit Number 4    Number of Visits 21    Date for PT Re-Evaluation 04/13/24    Authorization Type [employer]    PT Start Time 0715    PT Stop Time 0757    PT Time Calculation (min) 42 min    Activity Tolerance Patient tolerated treatment well    Behavior During Therapy WFL for tasks assessed/performed          Past Medical History:  Diagnosis Date   Allergy    OA (osteoarthritis) of knee, right    Past Surgical History:  Procedure Laterality Date   ANTERIOR CRUCIATE LIGAMENT REPAIR Right    Patient Active Problem List   Diagnosis Date Noted   Chronic pain of right knee 07/21/2018   Anxiety 11/10/2013   AR (allergic rhinitis) 11/10/2013    PCP:    Job Lukes, PA    REFERRING PROVIDER:    Kendal Franky SQUIBB, MD    REFERRING DIAG:  Kendal Franky SQUIBB, MD     THERAPY DIAG:  Pain in left hip  Muscle weakness (generalized)  Stiffness of left hip, not elsewhere classified  Rationale for Evaluation and Treatment: Rehabilitation  ONSET DATE: 1 year  SUBJECTIVE:   SUBJECTIVE STATEMENT:              The patient reports he is doing a little better. He is just having pain in certain positions.   EVAL: Pt notes that the hip is painful and limited that feels like a disconnection and feeling like he has to drag the hip. Pt feels a catch in the front of the hip and that feels like it's disconnected. Feels like it is loose and unstable. Unable to perform lateral movements. Feels like it gives way. Pt feels like he might have sciatica symptoms as well. Denies specific MOI. Plays tennis, golf, running, and workouts out in the gym. Denies NT. Denies sudden onset of leg weakness. Did get a CSI that did not make a large difference. Denies LBP. Denies cancer red flags. Denies  GI or kidney related pain.   Pt works out about 3-5x/week.   PERTINENT HISTORY: R ACL  PAIN:  Are you having pain? Yes: NPRS scale: none at rest, 8/10 at worst  Pain location: L anterior and lateral hip, C shaped  Pain description: ache, sharp, catch Aggravating factors: end of the day Relieving factors: sitting, ibuprofen, Meloxicam   PRECAUTIONS: None  RED FLAGS: None   WEIGHT BEARING RESTRICTIONS: No  FALLS:  Has patient fallen in last 6 months? 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  DIAGNOSTIC FINDINGS: n/a in chart   Mild OA and CAM lesion on X-ray  PATIENT SURVEYS:   Extreme difficulty/unable (0), Quite a bit of difficulty (1), Moderate difficulty (2), Little difficulty (3), No difficulty (4)  Survey date:  eval  Any of your usual work, housework or school activities 4  2. Usual hobbies, recreational or sporting activities 2  3. Getting into/out of the bath 4  4. Walking between rooms 4  5. Putting on socks/shoes 3  6. Squatting  2  7. Lifting an object, like a bag of groceries from the floor 4  8. Performing light activities around your home 4  9. Performing heavy activities around your home 3  10. Getting into/out of a car 3  11. Walking 2 blocks 4  12. Walking 1 mile 4  13. Going up/down 10 stairs (1 flight) 4  14. Standing for 1 hour 4  15.  sitting for 1 hour 4  16. Running on even ground 2  17. Running on uneven ground 2  18. Making sharp turns while running fast 2  19. Hopping  2  20. Rolling over in bed 4  Score total:  65      COGNITION: Overall cognitive status: Within functional limits for tasks assessed                          PALPATION: TTP of L glutes and hypertonicity of lumbar paraspinals and QL   LOWER EXTREMITY ROM:   PROM L eval R eval  Hip flexion 100 p! 120  Hip extension -5 10  Hip abduction 35 40  Hip adduction      Hip internal rotation 15 p! 35  Hip external rotation 20 p! 45  Knee flexion      Knee extension      Ankle dorsiflexion      Ankle plantarflexion      Ankle inversion      Ankle eversion       (Blank rows = not tested)  MMT  HHD in lbs Right eval Left eval  Hip flexion 91.2 63.6  Hip extension 64.0 54.1  Hip abduction 79.0 59.8  Hip adduction    Hip internal rotation    Hip external rotation    Knee flexion    Knee extension    Ankle dorsiflexion    Ankle plantarflexion    Ankle inversion    Ankle eversion     (Blank rows = not tested)     GAIT:  Level of assistance: Independent Comments: WFL  Stairs: 1x flight - increased lumbar SB with ascend, lack of L hip rotation and extension on descent                                                                                                                                TREATMENT DATE:   7/16 Manual: Inferior and posterior glide grade 2 and 3 Reviewed self trigger point release with roller   There-ex: upright bike: reviewed set up 5 min L3   Neuro-re-ed:   Dead lift from elevated position:  23 lbs 3x12   Goblet squat in mirror:  15 lbs 3x12   Brdige with band 3x10   SL clam shell 3x10   Standing reach drill 3x10    7/9 Manual: Inferior and  posterior glide grade 2 and 3 Reviewed self trigger point release with roller    There-ex:  Leg press 3x12  70 lbs RPE of 2  90 lbs RPE of 3  115 RPE of 4  Cybex   LF Knee extensions  20 lbs 3x12 RPE of 4     Neuro-re ed  Cable walk 10 x 25 lbs   01/27/24  Manual: grade III lateral belt glide at hip, prone ER/IR Grade II-III AP glide, prone Grade III PA glide in progressive extension Squat test-retest throughout  manual Prone hip extension 2 x 10 Figure 4 bridge 2 x 10 1/2 kneeling hip flexor stretch with glute/core activation 3 x 20 second holds  01/14/24 Exercises - Seated Quadratus Lumborum Stretch in Chair  - 2 x daily - 7 x weekly - 1 sets - 3 reps - 30 hold - Supine Hip Internal and External Rotation  - 2 x daily - 7 x weekly - 2 sets - 10 reps - Figure 4 Bridge  - 2 x daily - 7 x weekly - 2 sets - 10 reps  Acceptable pain, self pain management, conservative vs surgical management, biomechanics of L hip and lumbar     PATIENT EDUCATION:  Education details: MOI, diagnosis, prognosis, anatomy, exercise progression, DOMS expectations, muscle firing,  envelope of function, HEP, POC  Person educated: Patient Education method: Explanation, Demonstration, Tactile cues, and Verbal cues Education comprehension: verbalized understanding, returned demonstration, verbal cues required, tactile cues required, and needs further education     HOME EXERCISE PROGRAM:     Access Code: [email or web address] URL: [email or web address] Date: 01/14/2024 Prepared by: Dale Call     ASSESSMENT:   CLINICAL IMPRESSION:  The patient motion was better. He also had less spasming in his TFL. We reviewed set up of the exercise bike. We also continued to expand his gym program. He did not have any increase in pain. Therapy will continue to advance as tolerated.   EVAL: Patient is a 48 y.o. male who was seen today for physical therapy evaluation and treatment for L hip pain. Pt's s/s appear consistent with potential L hip internal derangement. Pt with expected gait, ROM, and strength deficits on L hip with lumbar compensations. Pt does also have signs of radicular pain which is likely due to associated L lumbar tightness. Pt is stiffness limited at this time with moderately sensitive pain. Plan to proceed with ROM focus and progressive strength as tolerated. Consider belt mobilizations for hip and/or lumbar  mobilizations at next for pain relief and joint mobility. Pt would benefit from continued skilled therapy in order to reach goals and maximize functional L LE strength and ROM for full return to PLOF.      OBJECTIVE IMPAIRMENTS: decreased activity tolerance, decreased endurance, decreased mobility, difficulty walking, decreased strength, and pain.    ACTIVITY LIMITATIONS: standing, stairs, and locomotion level   PARTICIPATION LIMITATIONS: cleaning, shopping, and community activity   PERSONAL FACTORS: Fitness, 2+ comorbidities, and Time since onset of injury/illness/exacerbation are also affecting patient's functional outcome.    REHAB POTENTIAL: Good   CLINICAL DECISION MAKING: stable/uncomplicated   EVALUATION COMPLEXITY: low     GOALS:     SHORT TERM GOALS: Target date: 02/02/2024      Pt will be independent and compliant with HEP for improved pain, strength, and function.  Baseline: Goal status: INITIAL   2.  Pt will be able to demonstrate symmetrical PROM of the L hip  in order to demonstrate functional  improvement in LE function for progression more normalized gait and community mobility.    Baseline:  Goal status: INITIAL    3. Pt will report at least 2 pt reduction on NPRS scale for pain in order to demonstrate functional improvement with household activity, self care, and ADL.      Baseline:  Goal status: INITIAL      LONG TERM GOALS: Target date: 04/13/2024   Pt  will become independent with final HEP in order to demonstrate synthesis of PT education.  Baseline:  Goal status: INITIAL  2.  Pt will be able to demonstrate full depth squat without pain in order to demonstrate functional improvement in LE function for self-care and house hold duties.  Baseline:  Goal status: INITIAL  3.  Pt will be able to lift/squat/hold >45 lbs in order to demonstrate functional improvement in lumbopelvic strength for return to exercise.  Baseline:  Goal status: INITIAL  4.   Pt will be able to demonstrate full AROM of the L hip without pain in order to demonstrate functional improvement in LE function for progression towards advanced SL strength and motor control exercise.  Baseline:  Goal status: INITIAL  5.  Pt will have an at least 9 pt improvement in LEFS measure in order to demonstrate MCID improvement in daily function.  Baseline:  Goal status: INITIAL    PLAN:  PT FREQUENCY: 1-2x/week  PT DURATION: 12 weeks  PLANNED INTERVENTIONS: 97164- PT Re-evaluation, 97110-Therapeutic exercises, 97530- Therapeutic activity, 97112- Neuromuscular re-education, 97535- Self Care, [id number]- Manual therapy, [phone]- Gait training, [phone]- Orthotic Initial, [phone]- Aquatic Therapy, [phone]- Electrical stimulation (unattended), [phone]- Electrical stimulation (manual), [id number]- Vasopneumatic device, [id number]- Ultrasound, [id number]- Ionotophoresis 4mg /ml Dexamethasone, Patient/Family education, Balance training, Stair training, Taping, Joint mobilization, Joint manipulation, Spinal manipulation, Spinal mobilization, Cryotherapy, and Moist heat  PLAN FOR NEXT SESSION: L hip mobility, loaded stretching, lumbar mobility, nerve flossing   Alm JINNY Don, PT, DPT 02/18/2024, 7:36 AM

## 2024-02-20 LAB — LIPOPROTEIN A (LPA): Lipoprotein (a): <10 nmol/L (ref <75)

## 2024-02-21 ENCOUNTER — Encounter (HOSPITAL_BASED_OUTPATIENT_CLINIC_OR_DEPARTMENT_OTHER): Admitting: Physical Therapy

## 2024-02-25 ENCOUNTER — Encounter (HOSPITAL_BASED_OUTPATIENT_CLINIC_OR_DEPARTMENT_OTHER): Payer: Self-pay | Admitting: Physical Therapy

## 2024-02-25 ENCOUNTER — Ambulatory Visit (HOSPITAL_BASED_OUTPATIENT_CLINIC_OR_DEPARTMENT_OTHER): Admitting: Physical Therapy

## 2024-02-25 DIAGNOSIS — M6281 Muscle weakness (generalized): Secondary | ICD-10-CM

## 2024-02-25 DIAGNOSIS — M25652 Stiffness of left hip, not elsewhere classified: Secondary | ICD-10-CM

## 2024-02-25 DIAGNOSIS — M25552 Pain in left hip: Secondary | ICD-10-CM

## 2024-02-25 NOTE — Therapy (Signed)
 OUTPATIENT PHYSICAL THERAPY TREATMENT   Patient Name: Cristian Odom MRN: [id number] DOB:10-May-1976, 48 y.o., male Today's Date: 02/25/2024  END OF SESSION:  PT End of Session - 02/25/24 0741     Visit Number 5    Number of Visits 21    Date for PT Re-Evaluation 04/13/24    Authorization Type [employer]    PT Start Time [phone]    PT Stop Time 0800    PT Time Calculation (min) 44 min    Activity Tolerance Patient tolerated treatment well    Behavior During Therapy [facility] for tasks assessed/performed           Past Medical History:  Diagnosis Date   Allergy    OA (osteoarthritis) of knee, right    Past Surgical History:  Procedure Laterality Date   ANTERIOR CRUCIATE LIGAMENT REPAIR Right    Patient Active Problem List   Diagnosis Date Noted   Chronic pain of right knee 07/21/2018   Anxiety 11/10/2013   AR (allergic rhinitis) 11/10/2013    PCP:    Job Lukes, PA    REFERRING PROVIDER:    Kendal Franky SQUIBB, MD    REFERRING DIAG:  Kendal Franky SQUIBB, MD     THERAPY DIAG:  Pain in left hip  Muscle weakness (generalized)  Stiffness of left hip, not elsewhere classified  Rationale for Evaluation and Treatment: Rehabilitation  ONSET DATE: 1 year  SUBJECTIVE:   SUBJECTIVE STATEMENT:   Thr patient reports continued improvement. He is still having pain at end range.   EVAL: Pt notes that the hip is painful and limited that feels like a disconnection and feeling like he has to drag the hip. Pt feels a catch in the front of the hip and that feels like it's disconnected. Feels like it is loose and unstable. Unable to perform lateral movements. Feels like it gives way. Pt feels like he might have sciatica symptoms as well. Denies specific MOI. Plays tennis, golf, running, and workouts out in the gym. Denies NT. Denies sudden onset of leg weakness. Did get a CSI that did not make a large difference. Denies LBP. Denies cancer red flags. Denies GI or kidney related  pain.   Pt works out about 3-5x/week.   PERTINENT HISTORY: R ACL  PAIN:  Are you having pain? Yes: NPRS scale: none at rest, 8/10 at worst  Pain location: L anterior and lateral hip, C shaped  Pain description: ache, sharp, catch Aggravating factors: end of the day Relieving factors: sitting, ibuprofen, Meloxicam   PRECAUTIONS: None  RED FLAGS: None   WEIGHT BEARING RESTRICTIONS: No  FALLS:  Has patient fallen in last 6 months? 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  DIAGNOSTIC FINDINGS: n/a in chart   Mild OA and CAM lesion on X-ray  PATIENT SURVEYS:   Extreme difficulty/unable (0), Quite a bit of difficulty (1), Moderate difficulty (2), Little difficulty (3), No difficulty (4)  Survey date:  eval  Any of your usual work, housework or school activities 4  2. Usual hobbies, recreational or sporting activities 2  3. Getting into/out of the bath 4  4. Walking between rooms 4  5. Putting on socks/shoes 3  6.  Squatting  2  7. Lifting an object, like a bag of groceries from the floor 4  8. Performing light activities around your home 4  9. Performing heavy activities around your home 3  10. Getting into/out of a car 3  11. Walking 2 blocks 4  12. Walking 1 mile 4  13. Going up/down 10 stairs (1 flight) 4  14. Standing for 1 hour 4  15.  sitting for 1 hour 4  16. Running on even ground 2  17. Running on uneven ground 2  18. Making sharp turns while running fast 2  19. Hopping  2  20. Rolling over in bed 4  Score total:  65      COGNITION: Overall cognitive status: Within functional limits for tasks assessed                          PALPATION: TTP of L glutes and hypertonicity of lumbar paraspinals and QL   LOWER EXTREMITY ROM:   PROM L eval R eval  Hip flexion 100 p! 120  Hip extension -5 10  Hip abduction 35 40  Hip adduction      Hip internal rotation 15 p! 35  Hip external rotation 20 p! 45  Knee flexion      Knee extension      Ankle dorsiflexion      Ankle plantarflexion      Ankle inversion      Ankle eversion       (Blank rows = not tested)  MMT  HHD in lbs Right eval Left eval  Hip flexion 91.2 63.6  Hip extension 64.0 54.1  Hip abduction 79.0 59.8  Hip adduction    Hip internal rotation    Hip external rotation    Knee flexion    Knee extension    Ankle dorsiflexion    Ankle plantarflexion    Ankle inversion    Ankle eversion     (Blank rows = not tested)     GAIT:  Level of assistance: Independent Comments: WFL  Stairs: 1x flight - increased lumbar SB with ascend, lack of L hip rotation and extension on descent                                                                                                                                TREATMENT DATE:   7/23 Manual: Inferior and posterior glide grade 2 and 3 Reviewed self trigger point release with roller   There-ex: upright bike: reviewed set up 5 min L3   Neuro-re-ed:  Cable walk 20 lbs x5 30lbs x10  Chop 2x12 10 lbs  Pallof press 2x12 10 lbs  Brdige with band 2x15      7/16 Manual: Inferior and posterior glide grade 2 and 3 Reviewed self trigger point release with roller   There-ex: upright bike: reviewed set up 5 min L3  Neuro-re-ed:   Dead lift from elevated position:  23 lbs 3x12   Goblet squat in mirror:  15 lbs 3x12   Brdige with band 3x10   SL clam shell 3x10   Standing reach drill 3x10    7/9 Manual: Inferior and posterior glide grade 2 and 3 Reviewed self trigger point release with roller    There-ex:  Leg press 3x12  70 lbs RPE of 2  90 lbs RPE of 3  115 RPE of 4  Cybex    LF Knee extensions  20 lbs 3x12 RPE of 4     Neuro-re ed  Cable walk 10 x 25 lbs   01/27/24  Manual: grade III lateral belt glide at hip, prone ER/IR Grade II-III AP glide, prone Grade III PA glide in progressive extension Squat test-retest throughout manual Prone hip extension 2 x 10 Figure 4 bridge 2 x 10 1/2 kneeling hip flexor stretch with glute/core activation 3 x 20 second holds  01/14/24 Exercises - Seated Quadratus Lumborum Stretch in Chair  - 2 x daily - 7 x weekly - 1 sets - 3 reps - 30 hold - Supine Hip Internal and External Rotation  - 2 x daily - 7 x weekly - 2 sets - 10 reps - Figure 4 Bridge  - 2 x daily - 7 x weekly - 2 sets - 10 reps  Acceptable pain, self pain management, conservative vs surgical management, biomechanics of L hip and lumbar     PATIENT EDUCATION:  Education details: MOI, diagnosis, prognosis, anatomy, exercise progression, DOMS expectations, muscle firing,  envelope of function, HEP, POC  Person educated: Patient Education method: Explanation, Demonstration, Tactile cues, and Verbal cues Education comprehension: verbalized understanding, returned demonstration, verbal cues required, tactile cues required, and needs further education     HOME EXERCISE PROGRAM:     Access Code: [email or web address] URL: [email or web address] Date: 01/14/2024 Prepared by: Dale Call     ASSESSMENT:   CLINICAL IMPRESSION: Th patient continues to have improved motion. We reviewed a cable exercise series he can do at the gym. They focused on rotational stability and single leg stability. His ROM is improving. Therapy will continue to progress as tolerated.    EVAL: Patient is a 48 y.o. male who was seen today for physical therapy evaluation and treatment for L hip pain. Pt's s/s appear consistent with potential L hip internal derangement. Pt with expected gait, ROM, and strength deficits on L hip with lumbar compensations. Pt does also have signs of  radicular pain which is likely due to associated L lumbar tightness. Pt is stiffness limited at this time with moderately sensitive pain. Plan to proceed with ROM focus and progressive strength as tolerated. Consider belt mobilizations for hip and/or lumbar mobilizations at next for pain relief and joint mobility. Pt would benefit from continued skilled therapy in order to reach goals and maximize functional L LE strength and ROM for full return to PLOF.      OBJECTIVE IMPAIRMENTS: decreased activity tolerance, decreased endurance, decreased mobility, difficulty walking, decreased strength, and pain.    ACTIVITY LIMITATIONS: standing, stairs, and locomotion level   PARTICIPATION LIMITATIONS: cleaning, shopping, and community activity   PERSONAL FACTORS: Fitness, 2+ comorbidities, and Time since onset of injury/illness/exacerbation are also affecting patient's functional outcome.    REHAB POTENTIAL: Good   CLINICAL DECISION MAKING: stable/uncomplicated   EVALUATION COMPLEXITY: low     GOALS:     SHORT TERM GOALS: Target date: 02/02/2024  Pt will be independent and compliant with HEP for improved pain, strength, and function.  Baseline: Goal status: INITIAL   2.  Pt will be able to demonstrate symmetrical PROM of the L hip  in order to demonstrate functional improvement in LE function for progression more normalized gait and community mobility.    Baseline:  Goal status: INITIAL    3. Pt will report at least 2 pt reduction on NPRS scale for pain in order to demonstrate functional improvement with household activity, self care, and ADL.      Baseline:  Goal status: INITIAL      LONG TERM GOALS: Target date: 04/13/2024   Pt  will become independent with final HEP in order to demonstrate synthesis of PT education.  Baseline:  Goal status: INITIAL  2.  Pt will be able to demonstrate full depth squat without pain in order to demonstrate functional improvement in LE function  for self-care and house hold duties.  Baseline:  Goal status: INITIAL  3.  Pt will be able to lift/squat/hold >45 lbs in order to demonstrate functional improvement in lumbopelvic strength for return to exercise.  Baseline:  Goal status: INITIAL  4.  Pt will be able to demonstrate full AROM of the L hip without pain in order to demonstrate functional improvement in LE function for progression towards advanced SL strength and motor control exercise.  Baseline:  Goal status: INITIAL  5.  Pt will have an at least 9 pt improvement in LEFS measure in order to demonstrate MCID improvement in daily function.  Baseline:  Goal status: INITIAL    PLAN:  PT FREQUENCY: 1-2x/week  PT DURATION: 12 weeks  PLANNED INTERVENTIONS: 97164- PT Re-evaluation, 97110-Therapeutic exercises, 97530- Therapeutic activity, 97112- Neuromuscular re-education, 97535- Self Care, 02859- Manual therapy, [phone]- Gait training, [phone]- Orthotic Initial, [phone]- Aquatic Therapy, [phone]- Electrical stimulation (unattended), [phone]- Electrical stimulation (manual), [id number]- Vasopneumatic device, [id number]- Ultrasound, [id number]- Ionotophoresis 4mg /ml Dexamethasone, Patient/Family education, Balance training, Stair training, Taping, Joint mobilization, Joint manipulation, Spinal manipulation, Spinal mobilization, Cryotherapy, and Moist heat  PLAN FOR NEXT SESSION: L hip mobility, loaded stretching, lumbar mobility, nerve flossing   Alm JINNY Don, PT, DPT 02/25/2024, 1:29 PM

## 2024-03-01 DIAGNOSIS — N529 Male erectile dysfunction, unspecified: Secondary | ICD-10-CM | POA: Diagnosis not present

## 2024-03-02 ENCOUNTER — Ambulatory Visit (HOSPITAL_BASED_OUTPATIENT_CLINIC_OR_DEPARTMENT_OTHER): Admitting: Physical Therapy

## 2024-03-02 ENCOUNTER — Encounter (HOSPITAL_BASED_OUTPATIENT_CLINIC_OR_DEPARTMENT_OTHER): Payer: Self-pay | Admitting: Physical Therapy

## 2024-03-02 DIAGNOSIS — M25552 Pain in left hip: Secondary | ICD-10-CM | POA: Diagnosis not present

## 2024-03-02 DIAGNOSIS — M25652 Stiffness of left hip, not elsewhere classified: Secondary | ICD-10-CM

## 2024-03-02 DIAGNOSIS — M6281 Muscle weakness (generalized): Secondary | ICD-10-CM | POA: Diagnosis not present

## 2024-03-02 NOTE — Therapy (Signed)
 OUTPATIENT PHYSICAL THERAPY TREATMENT   Patient Name: Cristian Odom MRN: [id number] DOB:14-May-1976, 48 y.o., male Today's Date: 03/02/2024  END OF SESSION:  PT End of Session - 03/02/24 1407     Visit Number 6    Number of Visits 21    Date for PT Re-Evaluation 04/13/24    Authorization Type [employer]    PT Start Time 0803    PT Stop Time 0845    PT Time Calculation (min) 42 min    Activity Tolerance Patient tolerated treatment well    Behavior During Therapy WFL for tasks assessed/performed            Past Medical History:  Diagnosis Date   Allergy    OA (osteoarthritis) of knee, right    Past Surgical History:  Procedure Laterality Date   ANTERIOR CRUCIATE LIGAMENT REPAIR Right    Patient Active Problem List   Diagnosis Date Noted   Chronic pain of right knee 07/21/2018   Anxiety 11/10/2013   AR (allergic rhinitis) 11/10/2013    PCP:    Job Lukes, PA    REFERRING PROVIDER:    Kendal Franky SQUIBB, MD    REFERRING DIAG:  Kendal Franky SQUIBB, MD     THERAPY DIAG:  Pain in left hip  Muscle weakness (generalized)  Stiffness of left hip, not elsewhere classified  Rationale for Evaluation and Treatment: Rehabilitation  ONSET DATE: 1 year  SUBJECTIVE:   SUBJECTIVE STATEMENT: The patient has minor pain at times. Otherwise he is doing well. He has been on vacation without noting any major increase ina pain.   EVAL: Pt notes that the hip is painful and limited that feels like a disconnection and feeling like he has to drag the hip. Pt feels a catch in the front of the hip and that feels like it's disconnected. Feels like it is loose and unstable. Unable to perform lateral movements. Feels like it gives way. Pt feels like he might have sciatica symptoms as well. Denies specific MOI. Plays tennis, golf, running, and workouts out in the gym. Denies NT. Denies sudden onset of leg weakness. Did get a CSI that did not make a large difference. Denies LBP.  Denies cancer red flags. Denies GI or kidney related pain.   Pt works out about 3-5x/week.   PERTINENT HISTORY: R ACL  PAIN:  Are you having pain? Yes: NPRS scale: none at rest, 8/10 at worst  Pain location: L anterior and lateral hip, C shaped  Pain description: ache, sharp, catch Aggravating factors: end of the day Relieving factors: sitting, ibuprofen, Meloxicam   PRECAUTIONS: None  RED FLAGS: None   WEIGHT BEARING RESTRICTIONS: No  FALLS:  Has patient fallen in last 6 months? 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  DIAGNOSTIC FINDINGS: n/a in chart   Mild OA and CAM lesion on X-ray  PATIENT SURVEYS:   Extreme difficulty/unable (0), Quite a bit of difficulty (1), Moderate difficulty (2), Little difficulty (3), No difficulty (4)  Survey date:  eval  Any of your usual work, housework or school activities 4  2. Usual hobbies, recreational or sporting activities 2  3. Getting into/out of the bath 4  4. Walking between  rooms 4  5. Putting on socks/shoes 3  6. Squatting  2  7. Lifting an object, like a bag of groceries from the floor 4  8. Performing light activities around your home 4  9. Performing heavy activities around your home 3  10. Getting into/out of a car 3  11. Walking 2 blocks 4  12. Walking 1 mile 4  13. Going up/down 10 stairs (1 flight) 4  14. Standing for 1 hour 4  15.  sitting for 1 hour 4  16. Running on even ground 2  17. Running on uneven ground 2  18. Making sharp turns while running fast 2  19. Hopping  2  20. Rolling over in bed 4  Score total:  65      COGNITION: Overall cognitive status: Within  functional limits for tasks assessed                         PALPATION: TTP of L glutes and hypertonicity of lumbar paraspinals and QL   LOWER EXTREMITY ROM:   PROM L eval R eval  Hip flexion 100 p! 120  Hip extension -5 10  Hip abduction 35 40  Hip adduction      Hip internal rotation 15 p! 35  Hip external rotation 20 p! 45  Knee flexion      Knee extension      Ankle dorsiflexion      Ankle plantarflexion      Ankle inversion      Ankle eversion       (Blank rows = not tested)  MMT  HHD in lbs Right eval Left eval  Hip flexion 91.2 63.6  Hip extension 64.0 54.1  Hip abduction 79.0 59.8  Hip adduction    Hip internal rotation    Hip external rotation    Knee flexion    Knee extension    Ankle dorsiflexion    Ankle plantarflexion    Ankle inversion    Ankle eversion     (Blank rows = not tested)     GAIT:  Level of assistance: Independent Comments: WFL  Stairs: 1x flight - increased lumbar SB with ascend, lack of L hip rotation and extension on descent                                                                                                                                TREATMENT DATE:   7/29 There-ex:  Knee extension machine 3x10 25 lbs  Leg press machine 3x10 70  Ex bike 5 min L 5   Neuro-re-ed:  Hop on and off air-ex x20  Eccentric step down 3x10 4 ich both ways.  Single leg reach drill 3x10   Reviewed HEP and how to use .  There-ex:  Anterior hip stretch with self soft tissue mobilization.   7/23 Manual: Inferior and posterior glide grade 2 and 3 Reviewed  self trigger point release with roller   There-ex: upright bike: reviewed set up 5 min L3   Neuro-re-ed:  Cable walk 20 lbs x5 30lbs x10  Chop 2x12 10 lbs  Pallof press 2x12 10 lbs  Brdige with band 2x15      7/16 Manual: Inferior and posterior glide grade 2 and 3 Reviewed self trigger point release with roller   There-ex: upright bike: reviewed set up 5 min L3    Neuro-re-ed:   Dead lift from elevated position:  23 lbs 3x12   Goblet squat in mirror:  15 lbs 3x12   Brdige with band 3x10   SL clam shell 3x10   Standing reach drill 3x10    PATIENT EDUCATION:  Education details: MOI, diagnosis, prognosis, anatomy, exercise progression, DOMS expectations, muscle firing,  envelope of function, HEP, POC  Person educated: Patient Education method: Explanation, Demonstration, Tactile cues, and Verbal cues Education comprehension: verbalized understanding, returned demonstration, verbal cues required, tactile cues required, and needs further education     HOME EXERCISE PROGRAM:     Access Code: [email or web address] URL: [email or web address] Date: 01/14/2024 Prepared by: Dale Call     ASSESSMENT:   CLINICAL IMPRESSION: Therapy gave the patient eccentric loading and stability exercises to do at home. We reviewed what exercises to do at home. He will work on his gym program on his own for now. We may follow up in a few weeks to see how his gym program is doing. He will otherwise D/C to HEP.    EVAL: Patient is a 48 y.o. male who was seen today for physical therapy evaluation and treatment for L hip pain. Pt's s/s appear consistent with potential L hip internal derangement. Pt with expected gait, ROM, and strength deficits on L hip with lumbar compensations. Pt does also have signs of radicular pain which is likely due to associated L lumbar tightness. Pt is stiffness limited at this time with moderately sensitive pain. Plan to proceed with ROM focus and progressive strength as tolerated. Consider belt mobilizations for hip and/or lumbar mobilizations at next for pain relief and joint mobility. Pt would benefit from continued skilled therapy in order to reach goals and maximize functional L LE strength and ROM for full return to PLOF.      OBJECTIVE IMPAIRMENTS: decreased activity tolerance, decreased endurance, decreased mobility, difficulty  walking, decreased strength, and pain.    ACTIVITY LIMITATIONS: standing, stairs, and locomotion level   PARTICIPATION LIMITATIONS: cleaning, shopping, and community activity   PERSONAL FACTORS: Fitness, 2+ comorbidities, and Time since onset of injury/illness/exacerbation are also affecting patient's functional outcome.    REHAB POTENTIAL: Good   CLINICAL DECISION MAKING: stable/uncomplicated   EVALUATION COMPLEXITY: low     GOALS:     SHORT TERM GOALS: Target date: 02/02/2024      Pt will be independent and compliant with HEP for improved pain, strength, and function.  Baseline: Goal status: INITIAL   2.  Pt will be able to demonstrate symmetrical PROM of the L hip  in order to demonstrate functional improvement in LE function for progression more normalized gait and community mobility.    Baseline:  Goal status: INITIAL    3. Pt will report at least 2 pt reduction on NPRS scale for pain in order to demonstrate functional improvement with household activity, self care, and ADL.      Baseline:  Goal status: INITIAL      LONG TERM GOALS: Target date: 04/13/2024   Pt  will  become independent with final HEP in order to demonstrate synthesis of PT education.  Baseline:  Goal status: INITIAL  2.  Pt will be able to demonstrate full depth squat without pain in order to demonstrate functional improvement in LE function for self-care and house hold duties.  Baseline:  Goal status: INITIAL  3.  Pt will be able to lift/squat/hold >45 lbs in order to demonstrate functional improvement in lumbopelvic strength for return to exercise.  Baseline:  Goal status: INITIAL  4.  Pt will be able to demonstrate full AROM of the L hip without pain in order to demonstrate functional improvement in LE function for progression towards advanced SL strength and motor control exercise.  Baseline:  Goal status: INITIAL  5.  Pt will have an at least 9 pt improvement in LEFS measure in order  to demonstrate MCID improvement in daily function.  Baseline:  Goal status: INITIAL    PLAN:  PT FREQUENCY: 1-2x/week  PT DURATION: 12 weeks  PLANNED INTERVENTIONS: 97164- PT Re-evaluation, 97110-Therapeutic exercises, 97530- Therapeutic activity, 97112- Neuromuscular re-education, 97535- Self Care, 02859- Manual therapy, [phone]- Gait training, [phone]- Orthotic Initial, [phone]- Aquatic Therapy, [phone]- Electrical stimulation (unattended), [phone]- Electrical stimulation (manual), [id number]- Vasopneumatic device, [id number]- Ultrasound, [id number]- Ionotophoresis 4mg /ml Dexamethasone, Patient/Family education, Balance training, Stair training, Taping, Joint mobilization, Joint manipulation, Spinal manipulation, Spinal mobilization, Cryotherapy, and Moist heat  PLAN FOR NEXT SESSION: L hip mobility, loaded stretching, lumbar mobility, nerve flossing   Alm JINNY Don, PT, DPT 03/02/2024, 2:11 PM

## 2024-03-04 ENCOUNTER — Encounter (HOSPITAL_BASED_OUTPATIENT_CLINIC_OR_DEPARTMENT_OTHER): Admitting: Physical Therapy

## 2024-03-24 ENCOUNTER — Other Ambulatory Visit (HOSPITAL_COMMUNITY): Payer: Self-pay

## 2024-03-30 DIAGNOSIS — E291 Testicular hypofunction: Secondary | ICD-10-CM | POA: Diagnosis not present

## 2024-04-01 DIAGNOSIS — N529 Male erectile dysfunction, unspecified: Secondary | ICD-10-CM | POA: Diagnosis not present

## 2024-04-01 DIAGNOSIS — E291 Testicular hypofunction: Secondary | ICD-10-CM | POA: Diagnosis not present

## 2024-04-22 ENCOUNTER — Other Ambulatory Visit (HOSPITAL_COMMUNITY): Payer: Self-pay

## 2024-04-22 ENCOUNTER — Other Ambulatory Visit: Payer: Self-pay

## 2024-04-22 ENCOUNTER — Other Ambulatory Visit: Payer: Self-pay | Admitting: Physician Assistant

## 2024-04-22 MED ORDER — ALPRAZOLAM 0.25 MG PO TABS
0.2500 mg | ORAL_TABLET | Freq: Two times a day (BID) | ORAL | 0 refills | Status: AC | PRN
  Filled 2024-04-22: qty 20, 10d supply, fill #0

## 2024-04-22 NOTE — Telephone Encounter (Signed)
 PT requesting refill for Alprazolam  0.25 mg. Last OV 02/17/2024.

## 2024-04-23 ENCOUNTER — Other Ambulatory Visit (HOSPITAL_COMMUNITY): Payer: Self-pay

## 2024-05-25 ENCOUNTER — Other Ambulatory Visit (HOSPITAL_COMMUNITY): Payer: Self-pay

## 2024-05-25 ENCOUNTER — Encounter (HOSPITAL_COMMUNITY): Payer: Self-pay

## 2024-06-23 ENCOUNTER — Other Ambulatory Visit: Payer: Self-pay | Admitting: Student

## 2024-06-23 DIAGNOSIS — M1612 Unilateral primary osteoarthritis, left hip: Secondary | ICD-10-CM

## 2024-06-27 ENCOUNTER — Other Ambulatory Visit: Payer: Self-pay | Admitting: Physician Assistant

## 2024-06-27 DIAGNOSIS — N528 Other male erectile dysfunction: Secondary | ICD-10-CM

## 2024-06-28 ENCOUNTER — Other Ambulatory Visit (HOSPITAL_COMMUNITY): Payer: Self-pay

## 2024-06-28 MED ORDER — MELOXICAM 15 MG PO TABS
ORAL_TABLET | ORAL | 0 refills | Status: AC
  Filled 2024-06-28: qty 30, 30d supply, fill #0

## 2024-07-14 ENCOUNTER — Other Ambulatory Visit (HOSPITAL_COMMUNITY): Payer: Self-pay

## 2024-07-14 ENCOUNTER — Inpatient Hospital Stay
Admission: RE | Admit: 2024-07-14 | Discharge: 2024-07-14 | Disposition: A | Source: Ambulatory Visit | Attending: Student | Admitting: Student

## 2024-07-14 ENCOUNTER — Other Ambulatory Visit: Payer: Self-pay

## 2024-07-14 ENCOUNTER — Other Ambulatory Visit: Payer: Self-pay | Admitting: Physician Assistant

## 2024-07-14 DIAGNOSIS — M1612 Unilateral primary osteoarthritis, left hip: Secondary | ICD-10-CM

## 2024-07-14 DIAGNOSIS — F32A Depression, unspecified: Secondary | ICD-10-CM

## 2024-07-14 DIAGNOSIS — M25552 Pain in left hip: Secondary | ICD-10-CM | POA: Diagnosis not present

## 2024-07-14 MED ORDER — BUPROPION HCL ER (XL) 300 MG PO TB24
300.0000 mg | ORAL_TABLET | Freq: Every day | ORAL | 1 refills | Status: AC
  Filled 2024-07-14: qty 90, 90d supply, fill #0

## 2024-07-14 MED ORDER — IOPAMIDOL (ISOVUE-M 200) INJECTION 41%
1.0000 mL | Freq: Once | INTRAMUSCULAR | Status: AC
  Administered 2024-07-14: 1 mL via INTRA_ARTICULAR

## 2024-07-14 MED ORDER — METHYLPREDNISOLONE ACETATE 40 MG/ML INJ SUSP (RADIOLOG
80.0000 mg | Freq: Once | INTRAMUSCULAR | Status: AC
  Administered 2024-07-14: 80 mg via INTRA_ARTICULAR

## 2025-02-18 ENCOUNTER — Encounter: Admitting: Physician Assistant
